# Patient Record
Sex: Female | Born: 1995
Health system: Southern US, Community
[De-identification: ages and names within clinical notes are randomized; demographics above are authoritative.]

## PROBLEM LIST (undated history)

## (undated) DIAGNOSIS — N942 Vaginismus: Secondary | ICD-10-CM

## (undated) DIAGNOSIS — G90A Postural orthostatic tachycardia syndrome (POTS): Secondary | ICD-10-CM

## (undated) DIAGNOSIS — I498 Other specified cardiac arrhythmias: Secondary | ICD-10-CM

## (undated) DIAGNOSIS — Z9189 Other specified personal risk factors, not elsewhere classified: Secondary | ICD-10-CM

## (undated) DIAGNOSIS — I10 Essential (primary) hypertension: Secondary | ICD-10-CM

## (undated) DIAGNOSIS — K3184 Gastroparesis: Secondary | ICD-10-CM

## (undated) DIAGNOSIS — I951 Orthostatic hypotension: Secondary | ICD-10-CM

## (undated) DIAGNOSIS — R000 Tachycardia, unspecified: Secondary | ICD-10-CM

## (undated) DIAGNOSIS — G43909 Migraine, unspecified, not intractable, without status migrainosus: Secondary | ICD-10-CM

## (undated) DIAGNOSIS — G4730 Sleep apnea, unspecified: Secondary | ICD-10-CM

## (undated) DIAGNOSIS — G901 Familial dysautonomia [Riley-Day]: Secondary | ICD-10-CM

## (undated) DIAGNOSIS — K219 Gastro-esophageal reflux disease without esophagitis: Secondary | ICD-10-CM

## (undated) DIAGNOSIS — M1990 Unspecified osteoarthritis, unspecified site: Secondary | ICD-10-CM

## (undated) HISTORY — DX: Migraine, unspecified, not intractable, without status migrainosus: G43.909

## (undated) HISTORY — DX: Gastroparesis: K31.84

## (undated) HISTORY — DX: Other specified cardiac arrhythmias: I49.8

## (undated) HISTORY — PX: ESOPHAGOGASTRODUODENOSCOPY: SHX1529

## (undated) HISTORY — DX: Orthostatic hypotension: I95.1

## (undated) HISTORY — PX: WISDOM TOOTH EXTRACTION: SHX21

## (undated) HISTORY — PX: TONSILLECTOMY AND ADENOIDECTOMY: SUR1326

## (undated) HISTORY — DX: Unspecified osteoarthritis, unspecified site: M19.90

## (undated) HISTORY — DX: Sleep apnea, unspecified: G47.30

## (undated) HISTORY — DX: Vaginismus: N94.2

## (undated) HISTORY — DX: Familial dysautonomia (riley-day): G90.1

## (undated) HISTORY — DX: Postural orthostatic tachycardia syndrome (POTS): G90.A

## (undated) HISTORY — DX: Tachycardia, unspecified: R00.0

## (undated) HISTORY — DX: Gastro-esophageal reflux disease without esophagitis: K21.9

## (undated) HISTORY — DX: Essential (primary) hypertension: I10

## (undated) HISTORY — DX: Other specified personal risk factors, not elsewhere classified: Z91.89

---
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
## 2017-01-21 ENCOUNTER — Encounter: Payer: Self-pay | Admitting: Family Medicine

## 2017-01-21 ENCOUNTER — Ambulatory Visit (INDEPENDENT_AMBULATORY_CARE_PROVIDER_SITE_OTHER): Payer: BLUE CROSS/BLUE SHIELD | Admitting: Family Medicine

## 2017-01-21 DIAGNOSIS — R21 Rash and other nonspecific skin eruption: Secondary | ICD-10-CM

## 2017-01-21 MED ORDER — PREDNISONE 10 MG PO TABS: ORAL_TABLET | ORAL | 0 refills | Status: DC

## 2017-01-21 NOTE — Progress Notes (Signed)
Subjective:  Patient ID: Diana Leon, female    DOB: [birth year]  Age: 21 y.o. MRN: [id number]  CC: New patient; Rash  HPI Diana Leon is a 21 y.o. female presents to the clinic today with complaints of rash.  Rash  2 months.  Located on the lower extremities, left greater than right.  Associated itching.  She has used some Benadryl at night to aid itching.  No new exposures.  She sleeps in the bed with her sister and her sister does not have a rash.  This was thought to be secondary to bug bites but they have not resolved. In fact her rash seems to be persistent and worsening.  No known relieving factors. No known exacerbating factors. No other associated symptoms.  Mucosal surfaces not affected.   PMH, Surgical Hx, Family Hx, Social History reviewed and updated as below.  Past Medical History:  Diagnosis Date  . Dysautonomia   . Gastroparesis   . Migraine   . POTS (postural orthostatic tachycardia syndrome)   . Sleep apnea    Past Surgical History:  Procedure Laterality Date  . ESOPHAGOGASTRODUODENOSCOPY    . TONSILLECTOMY AND ADENOIDECTOMY    . WISDOM TOOTH EXTRACTION     Family History  Problem Relation Age of Onset  . Asthma Mother   . Autoimmune disease Mother   . Diabetes Maternal Grandmother   . Sjogren's syndrome Paternal Grandmother   . Diabetes Paternal Grandfather    Social History  Substance Use Topics  . Smoking status: Never Smoker  . Smokeless tobacco: Not on file  . Alcohol use No   Review of Systems  Gastrointestinal:       Gastroparesis.   Skin: Positive for rash.  Neurological:       Dysautonomia.  All other systems reviewed and are negative.  Objective:   Today's Vitals: BP 90/70 (BP Location: Left Arm, Patient Position: Sitting, Cuff Size: Normal)   Pulse (!) 103   Temp 98.6 F (37 C) (Oral)   Wt 112 lb 6 oz (51 kg)   SpO2 99%   Physical Exam  Constitutional: She is oriented to person, place, and time. She  appears well-developed. No distress.  HENT:  Head: Normocephalic and atraumatic.  Mouth/Throat: Oropharynx is clear and moist.  Normal TM's bilaterally.  Eyes: Conjunctivae are normal. No scleral icterus.  Neck: Normal range of motion.  Cardiovascular: Normal rate and regular rhythm.   No murmur heard. Pulmonary/Chest: Effort normal. She has no wheezes. She has no rales.  Abdominal: Soft. She exhibits no distension.  Musculoskeletal: Normal range of motion.  Neurological: She is alert and oriented to person, place, and time.  Skin:  Lower extremities with scattered erythematous papules.  Psychiatric: She has a normal mood and affect.  Vitals reviewed.  Assessment & Plan:   Problem List Items Addressed This Visit    Rash    New problem. Uncertain etiology at this time. Does not appear to be infectious in origin. Will treat with a trial of oral corticosteroids. If does not improve/resolve, will need to see dermatology for assessment and possible biopsy.         Meds ordered this encounter  Medications  . atenolol (TENORMIN) 25 MG tablet    Sig: Take 25 mg by mouth.  . Cholecalciferol (VITAMIN D3) 3000 units TABS    Sig: Take by mouth.  Marland Kitchen. ibuprofen (ADVIL,MOTRIN) 400 MG tablet    Sig: Take 400 mg by mouth every 6 (six) hours.  Refill:  0  . Magnesium Gluconate (MAGONATE) 500 (27 Mg) MG TABS    Sig: Take 1 tablet by mouth daily.   . cyanocobalamin 500 MCG tablet    Sig: Take 500 mcg by mouth daily.  . predniSONE (DELTASONE) 10 MG tablet    Sig: 4 tablets daily x 3 days, then 3 tablets daily x 3 days, then 2 tablets daily x 3 days, then 1 tablet daily x 3 days.    Dispense:  30 tablet    Refill:  0    Follow-up: PRN  Everlene Other DO [facility]

## 2017-01-21 NOTE — Assessment & Plan Note (Signed)
New problem. Uncertain etiology at this time. Does not appear to be infectious in origin. Will treat with a trial of oral corticosteroids. If does not improve/resolve, will need to see dermatology for assessment and possible biopsy.

## 2017-01-21 NOTE — Patient Instructions (Signed)
Prednisone as prescribed.  If persists, recommend seeing dermatology.  Take care  Dr. Adriana Simasook

## 2017-01-24 ENCOUNTER — Telehealth: Payer: Self-pay | Admitting: Internal Medicine

## 2017-01-24 NOTE — Telephone Encounter (Signed)
Spoke with patient. Patient is aware of instructions. Requested to get referral to dermatology.

## 2017-01-24 NOTE — Telephone Encounter (Signed)
Pt mom Alecia called and stated that pt started her dose of prednisone yesterday. Last night pt was out of it, and had difficulty sleeping, and now pt started vomiting this morning. Please advise, thank you!  Call Alecia @ [phone].

## 2017-01-24 NOTE — Telephone Encounter (Signed)
Called patients mom back and attempted to speak with daughter to get verbal DPR. Phone call was disconnected, called back and left message for her to return call to our office.

## 2017-01-24 NOTE — Telephone Encounter (Signed)
Patients mom called. Patient started dose of prednisone yesterday and mom stated that patient was out of it and could not sleep. This AM patient has vomited x2. No acute distress. Instructed patients mom to hold prednisone and push fluids. Please advise, thanks.

## 2017-01-24 NOTE — Telephone Encounter (Signed)
Stop prednisone increase fluid intake. Can see derm for rash.

## 2017-01-27 ENCOUNTER — Other Ambulatory Visit: Payer: Self-pay | Admitting: Family Medicine

## 2017-01-27 DIAGNOSIS — R21 Rash and other nonspecific skin eruption: Secondary | ICD-10-CM

## 2017-01-27 NOTE — Progress Notes (Signed)
re

## 2017-06-23 ENCOUNTER — Encounter: Payer: Self-pay | Admitting: Internal Medicine

## 2017-06-23 ENCOUNTER — Ambulatory Visit: Payer: BLUE CROSS/BLUE SHIELD | Admitting: Internal Medicine

## 2017-06-23 VITALS — BP 126/78 | HR 83 | Temp 98.3°F | Ht 61.0 in | Wt 116.8 lb

## 2017-06-23 DIAGNOSIS — G4730 Sleep apnea, unspecified: Secondary | ICD-10-CM | POA: Diagnosis not present

## 2017-06-23 DIAGNOSIS — I951 Orthostatic hypotension: Secondary | ICD-10-CM | POA: Diagnosis not present

## 2017-06-23 DIAGNOSIS — R000 Tachycardia, unspecified: Secondary | ICD-10-CM | POA: Diagnosis not present

## 2017-06-23 DIAGNOSIS — R21 Rash and other nonspecific skin eruption: Secondary | ICD-10-CM | POA: Diagnosis not present

## 2017-06-23 DIAGNOSIS — M791 Myalgia, unspecified site: Secondary | ICD-10-CM

## 2017-06-23 DIAGNOSIS — G90A Postural orthostatic tachycardia syndrome (POTS): Secondary | ICD-10-CM

## 2017-06-23 DIAGNOSIS — I498 Other specified cardiac arrhythmias: Secondary | ICD-10-CM

## 2017-06-23 NOTE — Progress Notes (Signed)
Patient ID: Diana Leon, female   DOB: 1995/12/26, 22 y.o.   MRN: [id number]   Subjective:    Patient ID: Diana Leon, female    DOB: [birth year], 22 y.o.   MRN: [id number]  HPI  Patient here to establish care.  She is accompanied by her mother.  History obtained from both of them.  She has a history of dysautonomia with exaggerated/inappropriate sinus tachycardia and POTS. She is followed by [facility] cardiology (Dr Ace Gins).  Last evaluated in 05/2017.  No changes made in medication.  Has been receiving intermittent IV hydration.  This helps for a few days after receiving.  She still has intermittent episodes of increased heart rate and near syncope.  Having some trouble at school with access to handicap parking.  Apparently the spaces are taken up by non handicap students.  She has discussed this with her cardiologist as well.  She also reports a previous rash.  Saw rheumatology.  ANA slightly positive.  W/up in progress.  Rash better.  She has sleep apnea.  Does not use regularly.  She does not get a full night sleep when she uses.  Has tried various settings.  Has not seen her pulmonologist lately.  Is agreeable for f/u to discuss.  She has noticed recently some increased discomfort in her muscles - when hugged.  Plans to discuss more with rheumatology.  W/up in progress as outlined.  LMP 3 weeks ago.  Eating.  Trying to stay hydrated.  No vomiting.  Bowels stable.  Plans to get a service dog.    Past Medical History:  Diagnosis Date  . Arthritis   . Dysautonomia (HCC)   . Gastroparesis   . GERD (gastroesophageal reflux disease)   . History of fainting spells of unknown cause   . Hypertension   . Migraine   . POTS (postural orthostatic tachycardia syndrome)   . Sleep apnea    Past Surgical History:  Procedure Laterality Date  . ESOPHAGOGASTRODUODENOSCOPY    . TONSILLECTOMY AND ADENOIDECTOMY    . WISDOM TOOTH EXTRACTION     Family History  Problem Relation Age of Onset  . Asthma Mother     . Autoimmune disease Mother   . Diabetes Maternal Grandmother   . Sjogren's syndrome Paternal Grandmother   . Diabetes Paternal Grandfather    Social History   Socioeconomic History  . Marital status: Single    Spouse name: None  . Number of children: None  . Years of education: None  . Highest education level: None  Social Needs  . Financial resource strain: None  . Food insecurity - worry: None  . Food insecurity - inability: None  . Transportation needs - medical: None  . Transportation needs - non-medical: None  Occupational History  . Occupation: Consulting civil engineer  Tobacco Use  . Smoking status: Never Smoker  . Smokeless tobacco: Never Used  Substance and Sexual Activity  . Alcohol use: No  . Drug use: No  . Sexual activity: Not Currently    Partners: Male  Other Topics Concern  . None  Social History Narrative  . None    Outpatient Encounter Medications as of 06/23/2017  Medication Sig  . atenolol (TENORMIN) 25 MG tablet Take 25 mg by mouth.  . Cholecalciferol (VITAMIN D3) 3000 units TABS Take by mouth.  . cyanocobalamin 500 MCG tablet Take 500 mcg by mouth daily.  Marland Kitchen ibuprofen (ADVIL,MOTRIN) 400 MG tablet Take 400 mg by mouth every 6 (six) hours.  . Magnesium  Gluconate (MAGONATE) 500 (27 Mg) MG TABS Take 1 tablet by mouth daily.   . predniSONE (DELTASONE) 10 MG tablet 4 tablets daily x 3 days, then 3 tablets daily x 3 days, then 2 tablets daily x 3 days, then 1 tablet daily x 3 days.   No facility-administered encounter medications on file as of 06/23/2017.     Review of Systems  Constitutional: Negative for appetite change, fever and unexpected weight change.  HENT: Negative for congestion and sinus pressure.   Respiratory: Negative for cough, chest tightness and shortness of breath.   Cardiovascular: Negative for chest pain and leg swelling.       Intermittent increased heart rate and near syncope.  Gastrointestinal: Negative for abdominal pain, diarrhea, nausea and  vomiting.  Genitourinary: Negative for difficulty urinating and dysuria.  Musculoskeletal: Negative for back pain and joint swelling.  Skin: Negative for color change and rash.  Neurological: Negative for light-headedness and headaches.       Near syncope as outlined.   Psychiatric/Behavioral: Negative for agitation and dysphoric mood.       Objective:    Physical Exam  Constitutional: She appears well-developed and well-nourished. No distress.  HENT:  Nose: Nose normal.  Mouth/Throat: Oropharynx is clear and moist.  Neck: Neck supple. No thyromegaly present.  Cardiovascular: Normal rate and regular rhythm.  Pulmonary/Chest: Breath sounds normal. No respiratory distress. She has no wheezes.  Abdominal: Soft. Bowel sounds are normal. There is no tenderness.  Musculoskeletal: She exhibits no edema or tenderness.  Lymphadenopathy:    She has no cervical adenopathy.  Skin: No erythema.  No significant rash currently.  Better.   Psychiatric: She has a normal mood and affect. Her behavior is normal.    BP 126/78   Pulse 83   Temp 98.3 F (36.8 C) (Oral)   Ht 5\' 1"  (1.549 m)   Wt 116 lb 12.8 oz (53 kg)   SpO2 98%   BMI 22.07 kg/m  Wt Readings from Last 3 Encounters:  06/23/17 116 lb 12.8 oz (53 kg) (29 %, Z= -0.57)*  01/21/17 112 lb 6 oz (51 kg) (21 %, Z= -0.80)*   * Growth percentiles are based on CDC (Girls, 2-20 Years) data.     Lab Results  Component Value Date   WBC 4.8 08/27/2011   HGB 13.8 08/27/2011   HCT 40.5 08/27/2011   PLT 222 08/27/2011       Assessment & Plan:   Problem List Items Addressed This Visit    POTS (postural orthostatic tachycardia syndrome)    Followed by cardiology.  Still with intermittent episodes as outlined.  Receiving intermittent IV hydration.  Continue f/u with cardiology.  Planning to get a service dog.  Letter written.       Rash    Saw rheumatology and dermatology.  "lupus like".  Slightly elevated ANA.  Saw rheumatology.   W/up in progress.        Sleep apnea    Has documented sleep apnea.  Using BiPAP.  Not using regularly because she does not feel sleeps as well.  Has tried various settings.  Has not seen pulmonary recently.  Discussed f/u.  Arrange.        Tachycardia    On atenolol.  Followed by cardiology.  Receiving intermittent IV fluids.  Discussed staying hydrated, etc.         Other Visit Diagnoses    Muscle pain    -  Primary   reports noticing recently  when someone hugs her, etc.  seeing rheumatology.  w/up in progress for rash and slightly elevated ANA      I spent 45 minutes with the patient.  Time spent obtaining and discussing her past medical history and current issues and symptoms.  Time also spent discussing f/u plans.    Dale DurhamSCOTT, Jennefer Kopp, MD

## 2017-06-23 NOTE — Progress Notes (Signed)
Pre visit review using our clinic review tool, if applicable. No additional management support is needed unless otherwise documented below in the visit note. 

## 2017-06-26 ENCOUNTER — Encounter: Payer: Self-pay | Admitting: Internal Medicine

## 2017-06-26 DIAGNOSIS — R000 Tachycardia, unspecified: Secondary | ICD-10-CM | POA: Insufficient documentation

## 2017-06-26 DIAGNOSIS — I498 Other specified cardiac arrhythmias: Secondary | ICD-10-CM | POA: Insufficient documentation

## 2017-06-26 DIAGNOSIS — I951 Orthostatic hypotension: Secondary | ICD-10-CM

## 2017-06-26 DIAGNOSIS — G90A Postural orthostatic tachycardia syndrome (POTS): Secondary | ICD-10-CM | POA: Insufficient documentation

## 2017-06-26 DIAGNOSIS — G4730 Sleep apnea, unspecified: Secondary | ICD-10-CM | POA: Insufficient documentation

## 2017-06-26 NOTE — Assessment & Plan Note (Signed)
On atenolol.  Followed by cardiology.  Receiving intermittent IV fluids.  Discussed staying hydrated, etc.

## 2017-06-26 NOTE — Assessment & Plan Note (Signed)
Saw rheumatology and dermatology.  "lupus like".  Slightly elevated ANA.  Saw rheumatology.  W/up in progress.

## 2017-06-26 NOTE — Assessment & Plan Note (Signed)
Followed by cardiology.  Still with intermittent episodes as outlined.  Receiving intermittent IV hydration.  Continue f/u with cardiology.  Planning to get a service dog.  Letter written.

## 2017-06-26 NOTE — Assessment & Plan Note (Signed)
Has documented sleep apnea.  Using BiPAP.  Not using regularly because she does not feel sleeps as well.  Has tried various settings.  Has not seen pulmonary recently.  Discussed f/u.  Arrange.

## 2017-08-07 MED ORDER — LORAZEPAM 2 MG/ML IJ SOLN
0.50 mg | INTRAMUSCULAR | Status: DC
Start: ? — End: 2017-08-07

## 2017-08-07 MED ORDER — SODIUM CHLORIDE 0.9 % IV SOLN
100.00 | INTRAVENOUS | Status: DC
Start: ? — End: 2017-08-07

## 2017-08-07 MED ORDER — ACETAMINOPHEN 325 MG PO TABS
650.00 mg | ORAL_TABLET | ORAL | Status: DC
Start: ? — End: 2017-08-07

## 2017-08-07 MED ORDER — ONDANSETRON 4 MG PO TBDP
4.00 mg | ORAL_TABLET | ORAL | Status: DC
Start: ? — End: 2017-08-07

## 2017-08-07 MED ORDER — ATENOLOL 25 MG PO TABS
25.00 mg | ORAL_TABLET | ORAL | Status: DC
Start: 2017-08-07 — End: 2017-08-07

## 2017-12-31 ENCOUNTER — Encounter: Payer: BLUE CROSS/BLUE SHIELD | Admitting: Internal Medicine

## 2018-01-05 ENCOUNTER — Encounter: Payer: Self-pay | Admitting: Internal Medicine

## 2018-01-05 ENCOUNTER — Ambulatory Visit (INDEPENDENT_AMBULATORY_CARE_PROVIDER_SITE_OTHER): Payer: BLUE CROSS/BLUE SHIELD | Admitting: Internal Medicine

## 2018-01-05 VITALS — BP 108/68 | HR 128 | Temp 98.2°F | Ht 61.0 in | Wt 115.4 lb

## 2018-01-05 DIAGNOSIS — Z0000 Encounter for general adult medical examination without abnormal findings: Secondary | ICD-10-CM

## 2018-01-05 DIAGNOSIS — I951 Orthostatic hypotension: Secondary | ICD-10-CM

## 2018-01-05 DIAGNOSIS — L989 Disorder of the skin and subcutaneous tissue, unspecified: Secondary | ICD-10-CM

## 2018-01-05 DIAGNOSIS — R000 Tachycardia, unspecified: Secondary | ICD-10-CM

## 2018-01-05 DIAGNOSIS — G90A Postural orthostatic tachycardia syndrome (POTS): Secondary | ICD-10-CM

## 2018-01-05 DIAGNOSIS — I498 Other specified cardiac arrhythmias: Secondary | ICD-10-CM

## 2018-01-05 NOTE — Assessment & Plan Note (Signed)
[facility]  Just evaluated.  Atenolol increased.  Overall stable.  Follow.

## 2018-01-05 NOTE — Assessment & Plan Note (Signed)
Persistent.  Will have dermatology evaluate.  ?

## 2018-01-05 NOTE — Assessment & Plan Note (Signed)
Physical today 01/05/18.

## 2018-01-05 NOTE — Progress Notes (Signed)
Patient ID: Diana Leon, female   DOB: 1995-10-20, 22 y.o.   MRN: [id number]   Subjective:    Patient ID: Diana Leon, female    DOB: Jan 06, 1996, 22 y.o.   MRN: [id number]  HPI  Patient here for her physical exam.  She is accompanied by her mother.  History obtained from both of them.  Just evaluated by cardiology - Dr Ace Gins [facility] for f/u POTS.  Atenolol was increased.  She is taking 25mg  bid now.  Has service dog.  Doing well with the dog.  Can sense when she is getting ready to feel needs to sit down.  Overall doing well.  Feels things are stable.  No chest pain.  Heart rate stable.  Breathing stable.  No increased acid reflux.  No abdominal pain.  Bowels moving.  Handling stress.     Past Medical History:  Diagnosis Date  . Arthritis   . Dysautonomia (HCC)   . Gastroparesis   . GERD (gastroesophageal reflux disease)   . History of fainting spells of unknown cause   . Hypertension   . Migraine   . POTS (postural orthostatic tachycardia syndrome)   . Sleep apnea    Past Surgical History:  Procedure Laterality Date  . ESOPHAGOGASTRODUODENOSCOPY    . TONSILLECTOMY AND ADENOIDECTOMY    . WISDOM TOOTH EXTRACTION     Family History  Problem Relation Age of Onset  . Asthma Mother   . Autoimmune disease Mother   . Diabetes Maternal Grandmother   . Sjogren's syndrome Paternal Grandmother   . Diabetes Paternal Grandfather    Social History   Socioeconomic History  . Marital status: Single    Spouse name: Not on file  . Number of children: Not on file  . Years of education: Not on file  . Highest education level: Not on file  Occupational History  . Occupation: Consulting civil engineer  Social Needs  . Financial resource strain: Not on file  . Food insecurity:    Worry: Not on file    Inability: Not on file  . Transportation needs:    Medical: Not on file    Non-medical: Not on file  Tobacco Use  . Smoking status: Never Smoker  . Smokeless tobacco: Never Used  Substance and Sexual  Activity  . Alcohol use: No  . Drug use: No  . Sexual activity: Not Currently    Partners: Male  Lifestyle  . Physical activity:    Days per week: Not on file    Minutes per session: Not on file  . Stress: Not on file  Relationships  . Social connections:    Talks on phone: Not on file    Gets together: Not on file    Attends religious service: Not on file    Active member of club or organization: Not on file    Attends meetings of clubs or organizations: Not on file    Relationship status: Not on file  Other Topics Concern  . Not on file  Social History Narrative  . Not on file    Outpatient Encounter Medications as of 01/05/2018  Medication Sig  . atenolol (TENORMIN) 25 MG tablet Take 25 mg by mouth 2 (two) times daily.   . Cholecalciferol (VITAMIN D3) 3000 units TABS Take by mouth.  . cyanocobalamin 500 MCG tablet Take 500 mcg by mouth daily.  Marland Kitchen ibuprofen (ADVIL,MOTRIN) 400 MG tablet Take 400 mg by mouth every 6 (six) hours.  . Magnesium Gluconate (MAGONATE)  500 (27 Mg) MG TABS Take 1 tablet by mouth daily.   . [DISCONTINUED] atenolol (TENORMIN) 25 MG tablet Take 25 mg by mouth.  . [DISCONTINUED] predniSONE (DELTASONE) 10 MG tablet 4 tablets daily x 3 days, then 3 tablets daily x 3 days, then 2 tablets daily x 3 days, then 1 tablet daily x 3 days. (Patient not taking: Reported on 01/05/2018)   No facility-administered encounter medications on file as of 01/05/2018.     Review of Systems  Constitutional: Negative for appetite change and unexpected weight change.  HENT: Negative for congestion and sinus pressure.   Eyes: Negative for pain and visual disturbance.  Respiratory: Negative for cough, chest tightness and shortness of breath.   Cardiovascular: Negative for chest pain, palpitations and leg swelling.  Gastrointestinal: Negative for abdominal pain, diarrhea, nausea and vomiting.  Genitourinary: Negative for difficulty urinating and dysuria.  Musculoskeletal: Negative  for joint swelling and myalgias.  Skin: Negative for color change and rash.  Neurological: Negative for dizziness, light-headedness and headaches.  Hematological: Negative for adenopathy. Does not bruise/bleed easily.  Psychiatric/Behavioral: Negative for agitation and dysphoric mood.       Objective:    Physical Exam  Constitutional: She is oriented to person, place, and time. She appears well-developed and well-nourished. No distress.  HENT:  Nose: Nose normal.  Mouth/Throat: Oropharynx is clear and moist.  Eyes: Right eye exhibits no discharge. Left eye exhibits no discharge. No scleral icterus.  Neck: Neck supple. No thyromegaly present.  Cardiovascular: Normal rate and regular rhythm.  Pulmonary/Chest: Breath sounds normal. No accessory muscle usage. No tachypnea. No respiratory distress. She has no decreased breath sounds. She has no wheezes. She has no rhonchi. Right breast exhibits no inverted nipple, no mass, no nipple discharge and no tenderness (no axillary adenopathy). Left breast exhibits no inverted nipple, no mass, no nipple discharge and no tenderness (no axilarry adenopathy).  Abdominal: Soft. Bowel sounds are normal. There is no tenderness.  Musculoskeletal: She exhibits no edema or tenderness.  Lymphadenopathy:    She has no cervical adenopathy.  Neurological: She is alert and oriented to person, place, and time.  Skin: No rash noted. No erythema.  Psychiatric: She has a normal mood and affect. Her behavior is normal.    BP 108/68 (BP Location: Left Arm, Patient Position: Sitting, Cuff Size: Normal)   Pulse (!) 128   Temp 98.2 F (36.8 C) (Oral)   Ht 5\' 1"  (1.549 m)   Wt 115 lb 6 oz (52.3 kg)   SpO2 97%   BMI 21.80 kg/m  Wt Readings from Last 3 Encounters:  01/05/18 115 lb 6 oz (52.3 kg) (24 %, Z= -0.69)*  06/23/17 116 lb 12.8 oz (53 kg) (29 %, Z= -0.57)*  01/21/17 112 lb 6 oz (51 kg) (21 %, Z= -0.80)*   * Growth percentiles are based on CDC (Girls, 2-20  Years) data.     Lab Results  Component Value Date   WBC 4.8 08/27/2011   HGB 13.8 08/27/2011   HCT 40.5 08/27/2011   PLT 222 08/27/2011       Assessment & Plan:   Problem List Items Addressed This Visit    Healthcare maintenance    Physical today 01/05/18.        Leg lesion    Persistent.  Will have dermatology evaluate.        Relevant Orders   Ambulatory referral to Dermatology   POTS (postural orthostatic tachycardia syndrome)    Followed by  cardiology - Dr Ace Gins [facility].  Just evaluated.  Atenolol increased.  Overall stable.  Follow.        Relevant Medications   atenolol (TENORMIN) 25 MG tablet    Other Visit Diagnoses    Routine general medical examination at a health care facility    -  Primary       Dale [place], MD

## 2018-01-29 ENCOUNTER — Telehealth: Payer: Self-pay | Admitting: Internal Medicine

## 2018-01-29 NOTE — Telephone Encounter (Signed)
Patient's mother returning call. States that she understands the turn around time for paperwork. States that the patient was just informed last night that this paperwork had to be completed before she was able to stay in the apartment for school. States that she really appreciates having them filled out.

## 2018-01-29 NOTE — Telephone Encounter (Signed)
FYI

## 2018-01-29 NOTE — Telephone Encounter (Signed)
Pt mom dropped off service animal  form to be filled out handed to [place].

## 2018-01-29 NOTE — Telephone Encounter (Signed)
Morrie Sheldonshley told patients mom when paper work was dropped off that policy was 5-7 days for paperwork. Left message stating that our policy is typically 5-7 days but we would look at the paper work. I have given paper work to Dr Lorin PicketScott for review.

## 2018-01-30 NOTE — Telephone Encounter (Signed)
Form placed up front and left message for mom to let her know that it is ready

## 2018-01-30 NOTE — Telephone Encounter (Signed)
Form completed.  Placed in box.  She may need to sign one area.

## 2018-02-18 ENCOUNTER — Telehealth: Payer: Self-pay

## 2018-02-18 NOTE — Telephone Encounter (Signed)
Copied from CRM [id number]. Topic: General - Other >> Feb 18, 2018  4:41 PM Trula Slade wrote: Reason for CRM:   Dene Gentry VP of [facility] in [place], [place] [phone] would like for Dr. Lorin Picket to call her concerning the paperwork received for the patient.

## 2018-02-19 NOTE — Telephone Encounter (Signed)
Called.  Questions answered.   

## 2018-06-19 ENCOUNTER — Other Ambulatory Visit: Payer: Self-pay

## 2018-06-19 ENCOUNTER — Ambulatory Visit
Admission: EM | Admit: 2018-06-19 | Discharge: 2018-06-19 | Disposition: A | Discharge status: Home / Self Care | Payer: BLUE CROSS/BLUE SHIELD | Attending: Family Medicine | Admitting: Family Medicine

## 2018-06-19 DIAGNOSIS — B349 Viral infection, unspecified: Secondary | ICD-10-CM | POA: Diagnosis not present

## 2018-06-19 MED ORDER — SODIUM CHLORIDE 0.9 % IV BOLUS
1000.0000 mL | Freq: Once | INTRAVENOUS | Status: AC
  Administered 2018-06-19: 1000 mL via INTRAVENOUS

## 2018-06-19 MED ORDER — OSELTAMIVIR PHOSPHATE 75 MG PO CAPS: 75.0000 mg | ORAL_CAPSULE | Freq: Two times a day (BID) | ORAL | 0 refills | Status: DC

## 2018-06-19 NOTE — ED Triage Notes (Signed)
Patient states that she has pots syndrome and needs IV fluids due to a virus that she currently has. Patient states that she congestion, vomiting and headache.

## 2018-06-19 NOTE — ED Provider Notes (Addendum)
[facility]    CSN: [id number] Arrival date & time: 06/19/18  1719  History   Chief Complaint Chief Complaint  Patient presents with  . URI   HPI  23 year old female with a history of dysautonomia/pots presents with upper respiratory symptoms and decreased p.o. intake.  Patient states that she has been sick since Tuesday.  Her symptoms are worsening as of yesterday.  She reports sore throat, congestion, headaches, fever, T-max 101.  She was seen by pediatrics today with negative flu testing.  She is currently complaining of dizziness as well as her other symptoms.  She states that she is here for IV fluids.  She states that she was sent from pediatrics regarding this.  She states that she has not drink very much today.  She states that she feels nauseated.  She informs me that her cardiologist recommends that she gets IV fluids when she gets ill like this.  Symptoms are severe.  No other associated symptoms.  No known relieving factors.  No other complaints.  PMH, Surgical Hx, Family Hx, Social History reviewed and updated as below.  Past Medical History:  Diagnosis Date  . Arthritis   . Dysautonomia (HCC)   . Gastroparesis   . GERD (gastroesophageal reflux disease)   . History of fainting spells of unknown cause   . Hypertension   . Migraine   . POTS (postural orthostatic tachycardia syndrome)   . Sleep apnea     Patient Active Problem List   Diagnosis Date Noted  . Healthcare maintenance 01/05/2018  . Leg lesion 01/05/2018  . POTS (postural orthostatic tachycardia syndrome) 06/26/2017  . Sleep apnea 06/26/2017  . Tachycardia 06/26/2017  . Rash 01/21/2017    Past Surgical History:  Procedure Laterality Date  . ESOPHAGOGASTRODUODENOSCOPY    . TONSILLECTOMY AND ADENOIDECTOMY    . WISDOM TOOTH EXTRACTION      OB History   No obstetric history on file.      Home Medications    Prior to Admission medications   Medication Sig Start Date End Date  Taking? Authorizing Provider  atenolol (TENORMIN) 25 MG tablet Take 25 mg by mouth 2 (two) times daily.  12/09/17  Yes [provider]  Cholecalciferol (VITAMIN D3) 3000 units TABS Take by mouth.   Yes [provider]  cyanocobalamin 500 MCG tablet Take 500 mcg by mouth daily.   Yes [provider]  ibuprofen (ADVIL,MOTRIN) 400 MG tablet Take 400 mg by mouth every 6 (six) hours. 11/13/16  Yes [provider]  Magnesium Gluconate (MAGONATE) 500 (27 Mg) MG TABS Take 1 tablet by mouth daily.    Yes [provider]  oseltamivir (TAMIFLU) 75 MG capsule Take 1 capsule (75 mg total) by mouth every 12 (twelve) hours. 06/19/18   Tommie Sams, DO    Family History Family History  Problem Relation Age of Onset  . Asthma Mother   . Autoimmune disease Mother   . Diabetes Maternal Grandmother   . Sjogren's syndrome Paternal Grandmother   . Diabetes Paternal Grandfather     Social History Social History   Tobacco Use  . Smoking status: Never Smoker  . Smokeless tobacco: Never Used  Substance Use Topics  . Alcohol use: No  . Drug use: No     Allergies   Sunscreen spf30  [albolene]; Prednisone; and Promethazine   Review of Systems Review of Systems Per HPI  Physical Exam Triage Vital Signs ED Triage Vitals  Enc Vitals Group  BP 06/19/18 1758 102/68     Pulse Rate 06/19/18 1758 (!) 106     Resp 06/19/18 1758 18     Temp 06/19/18 1758 98.7 F (37.1 C)     Temp Source 06/19/18 1758 Oral     SpO2 06/19/18 1758 99 %     Weight 06/19/18 1756 112 lb (50.8 kg)     Height 06/19/18 1756 5\' 1"  (1.549 m)     Head Circumference --      Peak Flow --      Pain Score 06/19/18 1756 7     Pain Loc --      Pain Edu? --      Excl. in GC? --    Updated Vital Signs BP 102/68 (BP Location: Left Arm)   Pulse (!) 106   Temp 98.7 F (37.1 C) (Oral)   Resp 18   Ht 5\' 1"  (1.549 m)   Wt 50.8 kg   LMP 06/05/2018   SpO2 99%   BMI 21.16 kg/m    Visual Acuity Right Eye Distance:   Left Eye Distance:   Bilateral Distance:    Right Eye Near:   Left Eye Near:    Bilateral Near:     Physical Exam Vitals signs and nursing note reviewed.  Constitutional:      General: She is not in acute distress. HENT:     Head: Normocephalic and atraumatic.     Mouth/Throat:     Mouth: Mucous membranes are moist.     Pharynx: Oropharynx is clear.  Eyes:     General:        Right eye: No discharge.        Left eye: No discharge.     Conjunctiva/sclera: Conjunctivae normal.  Cardiovascular:     Rate and Rhythm: Regular rhythm. Tachycardia present.  Pulmonary:     Effort: Pulmonary effort is normal.     Breath sounds: No wheezing, rhonchi or rales.  Neurological:     Mental Status: She is alert.  Psychiatric:        Mood and Affect: Mood normal.        Behavior: Behavior normal.    UC Treatments / Results  Labs (all labs ordered are listed, but only abnormal results are displayed) Labs Reviewed - No data to display  EKG None  Radiology No results found.  Procedures Procedures (including critical care time)  Medications Ordered in UC Medications  sodium chloride 0.9 % bolus 1,000 mL (1,000 mLs Intravenous New Bag/Given 06/19/18 1827)    Initial Impression / Assessment and Plan / UC Course  I have reviewed the triage vital signs and the nursing notes.  Pertinent labs & imaging results that were available during my care of the patient were reviewed by me and considered in my medical decision making (see chart for details).    23 year old female presents with a viral illness. IV fluids given due to poor PO intake and POTS.  Discussed the possibility of influenza despite negative testing.  Father and patient elected to consider starting an antiviral.  Rx was sent.  Supportive care and Zofran as needed. Push fluids.  Final Clinical Impressions(s) / UC Diagnoses   Final diagnoses:  Viral illness     Discharge  Instructions     Rest. Fluids  Zofran as needed.  Take care  Dr. Adriana Simas     ED Prescriptions    Medication Sig Dispense Auth. Provider   oseltamivir (TAMIFLU) 75 MG  capsule Take 1 capsule (75 mg total) by mouth every 12 (twelve) hours. 10 capsule Tommie Samsook, Taiyana Kissler G, DO     Controlled Substance Prescriptions [place] Controlled Substance Registry consulted? Not Applicable   Tommie SamsCook, Jayesh Marbach G, DO 06/19/18 1858    Tommie Samsook, Nicklous Aburto G, DO 06/19/18 [id number]

## 2018-06-19 NOTE — Discharge Instructions (Signed)
Rest. Fluids. ° °Zofran as needed.  ° °Take care ° °Dr. Yong Wahlquist  °

## 2018-06-25 ENCOUNTER — Ambulatory Visit: Payer: BLUE CROSS/BLUE SHIELD | Admitting: Internal Medicine

## 2018-06-25 DIAGNOSIS — R51 Headache: Secondary | ICD-10-CM

## 2018-06-25 DIAGNOSIS — R109 Unspecified abdominal pain: Secondary | ICD-10-CM

## 2018-06-25 DIAGNOSIS — M25532 Pain in left wrist: Secondary | ICD-10-CM

## 2018-06-25 DIAGNOSIS — R000 Tachycardia, unspecified: Secondary | ICD-10-CM | POA: Diagnosis not present

## 2018-06-25 DIAGNOSIS — I951 Orthostatic hypotension: Secondary | ICD-10-CM

## 2018-06-25 DIAGNOSIS — G8929 Other chronic pain: Secondary | ICD-10-CM

## 2018-06-25 DIAGNOSIS — I498 Other specified cardiac arrhythmias: Secondary | ICD-10-CM

## 2018-06-25 DIAGNOSIS — M25531 Pain in right wrist: Secondary | ICD-10-CM

## 2018-06-25 DIAGNOSIS — G4730 Sleep apnea, unspecified: Secondary | ICD-10-CM

## 2018-06-25 DIAGNOSIS — G90A Postural orthostatic tachycardia syndrome (POTS): Secondary | ICD-10-CM

## 2018-06-25 DIAGNOSIS — R519 Headache, unspecified: Secondary | ICD-10-CM

## 2018-06-25 LAB — BASIC METABOLIC PANEL
BUN: 10 mg/dL (ref 6–23)
CO2: 28 mEq/L (ref 19–32)
Calcium: 9.9 mg/dL (ref 8.4–10.5)
Chloride: 102 mEq/L (ref 96–112)
Creatinine, Ser: 0.67 mg/dL (ref 0.40–1.20)
GFR: 118.72 mL/min (ref >60.00)
Glucose, Bld: 93 mg/dL (ref 70–99)
Potassium: 4 mEq/L (ref 3.5–5.1)
Sodium: 136 mEq/L (ref 135–145)

## 2018-06-25 LAB — CBC WITH DIFFERENTIAL/PLATELET
Basophils Absolute: 0 10*3/uL (ref 0.0–0.1)
Basophils Relative: 0.7 % (ref 0.0–3.0)
Eosinophils Absolute: 0.1 10*3/uL (ref 0.0–0.7)
Eosinophils Relative: 2.4 % (ref 0.0–5.0)
HCT: 43.5 % (ref 36.0–46.0)
Hemoglobin: 14.7 g/dL (ref 12.0–15.0)
Lymphocytes Relative: 44.4 % (ref 12.0–46.0)
Lymphs Abs: 1.7 10*3/uL (ref 0.7–4.0)
MCHC: 33.8 g/dL (ref 30.0–36.0)
MCV: 87.1 fl (ref 78.0–100.0)
Monocytes Absolute: 0.3 10*3/uL (ref 0.1–1.0)
Monocytes Relative: 7.8 % (ref 3.0–12.0)
Neutro Abs: 1.7 10*3/uL (ref 1.4–7.7)
Neutrophils Relative %: 44.7 % (ref 43.0–77.0)
Platelets: 212 10*3/uL (ref 150.0–400.0)
RBC: 4.99 Mil/uL (ref 3.87–5.11)
RDW: 12.9 % (ref 11.5–14.6)
WBC: 3.8 10*3/uL — ABNORMAL LOW (ref 4.5–10.5)

## 2018-06-25 LAB — HEPATIC FUNCTION PANEL
ALBUMIN: 4.5 g/dL (ref 3.5–5.2)
ALT: 11 U/L (ref 0–35)
AST: 14 U/L (ref 0–37)
Alkaline Phosphatase: 51 U/L (ref 39–117)
Bilirubin, Direct: 0.1 mg/dL (ref 0.0–0.3)
Total Bilirubin: 0.3 mg/dL (ref 0.2–1.2)
Total Protein: 6.8 g/dL (ref 6.0–8.3)

## 2018-06-25 LAB — LIPASE: Lipase: 10 U/L — ABNORMAL LOW (ref 11.0–59.0)

## 2018-06-25 LAB — TSH: TSH: 3.18 u[IU]/mL (ref 0.35–5.50)

## 2018-06-25 LAB — SEDIMENTATION RATE: Sed Rate: 1 mm/hr (ref 0–20)

## 2018-06-25 LAB — AMYLASE: AMYLASE: 35 U/L (ref 27–131)

## 2018-06-25 NOTE — Patient Instructions (Signed)
pepcid 20mg - take one tablet 30 minutes before breakfast 

## 2018-06-25 NOTE — Progress Notes (Signed)
Patient ID: Quitman Livings, female   DOB: Dec 22, 1995, 23 y.o.   MRN: [id number]   Subjective:    Patient ID: Quitman Livings, female    DOB: 1995-07-05, 23 y.o.   MRN: [id number]  HPI  Patient here for a scheduled follow up.  She is accompanied by her mother.  History obtained from both of them.  Reports bilateral wrist pain.  States notices when she types on a keyboard.  Saw her chiropractor.  Had "shock therapy".  Did not help.  Also reports some nausea.  Also reports some stomach pain after eating.  Some emesis occasionally.  Has tried eating smaller portions - eating more frequent.  May flare 2x/week.  Has had GI issues since she was young.  Had UGI previously.  Revealed some reflux.  Persistent/worsening problem now.  Also reports headaches.  States occurs on left side.  States left side of face will be numb when occurs.  Also reports some drooping of her face when occurs.  Has been present and worsened for the last few months.  Has been taking ibuprofen.  States has to lie down and cut lights off.  Was diagnosed at age 23 with chronic migraines.  Had MRI age 23.  States these headaches occurred all over there head - not just left sided.  States the headaches now may last up to 2 hours.  Trying to stay active.  Has her support dog.  He has helped significantly with sensing her "episodes".  No chest pain.  Breathing stable.  Bowels moving.     Past Medical History:  Diagnosis Date  . Arthritis   . Dysautonomia ([place])   . Gastroparesis   . GERD (gastroesophageal reflux disease)   . History of fainting spells of unknown cause   . Hypertension   . Migraine   . POTS (postural orthostatic tachycardia syndrome)   . Sleep apnea    Past Surgical History:  Procedure Laterality Date  . ESOPHAGOGASTRODUODENOSCOPY    . TONSILLECTOMY AND ADENOIDECTOMY    . WISDOM TOOTH EXTRACTION     Family History  Problem Relation Age of Onset  . Asthma Mother   . Autoimmune disease Mother   . Diabetes Maternal  Grandmother   . Sjogren's syndrome Paternal Grandmother   . Diabetes Paternal Grandfather    Social History   Socioeconomic History  . Marital status: Single    Spouse name: Not on file  . Number of children: Not on file  . Years of education: Not on file  . Highest education level: Not on file  Occupational History  . Occupation: Ship broker  Social Needs  . Financial resource strain: Not on file  . Food insecurity:    Worry: Not on file    Inability: Not on file  . Transportation needs:    Medical: Not on file    Non-medical: Not on file  Tobacco Use  . Smoking status: Never Smoker  . Smokeless tobacco: Never Used  Substance and Sexual Activity  . Alcohol use: No  . Drug use: No  . Sexual activity: Not Currently    Partners: Male  Lifestyle  . Physical activity:    Days per week: Not on file    Minutes per session: Not on file  . Stress: Not on file  Relationships  . Social connections:    Talks on phone: Not on file    Gets together: Not on file    Attends religious service: Not on file  Active member of club or organization: Not on file    Attends meetings of clubs or organizations: Not on file    Relationship status: Not on file  Other Topics Concern  . Not on file  Social History Narrative  . Not on file    Outpatient Encounter Medications as of 06/25/2018  Medication Sig  . atenolol (TENORMIN) 25 MG tablet Take 25 mg by mouth 2 (two) times daily.   . Cholecalciferol (VITAMIN D3) 3000 units TABS Take by mouth.  . cyanocobalamin 500 MCG tablet Take 500 mcg by mouth daily.  Marland Kitchen ibuprofen (ADVIL,MOTRIN) 400 MG tablet Take 400 mg by mouth every 6 (six) hours.  . Magnesium Gluconate (MAGONATE) 500 (27 Mg) MG TABS Take 1 tablet by mouth daily.   . [DISCONTINUED] oseltamivir (TAMIFLU) 75 MG capsule Take 1 capsule (75 mg total) by mouth every 12 (twelve) hours.   No facility-administered encounter medications on file as of 06/25/2018.     Review of Systems    Constitutional: Negative for appetite change and unexpected weight change.  HENT: Negative for congestion and sinus pressure.   Respiratory: Negative for cough, chest tightness and shortness of breath.   Cardiovascular: Negative for chest pain, palpitations and leg swelling.  Gastrointestinal: Positive for abdominal pain, nausea and vomiting. Negative for diarrhea.       Intermittent abdominal pain after eating.    Genitourinary: Negative for difficulty urinating and dysuria.  Musculoskeletal: Negative for joint swelling.       Bilateral wrist pain.    Skin: Negative for color change and rash.  Neurological: Positive for headaches. Negative for dizziness and light-headedness.  Psychiatric/Behavioral: Negative for agitation and dysphoric mood.      Objective:     Blood pressure recheck:  106/74  Physical Exam Constitutional:      General: She is not in acute distress.    Appearance: Normal appearance.  HENT:     Nose: Nose normal. No congestion.     Mouth/Throat:     Pharynx: No oropharyngeal exudate or posterior oropharyngeal erythema.  Eyes:     Pupils: Pupils are equal, round, and reactive to light.  Neck:     Musculoskeletal: Neck supple. No muscular tenderness.     Thyroid: No thyromegaly.  Cardiovascular:     Rate and Rhythm: Normal rate and regular rhythm.  Pulmonary:     Effort: No respiratory distress.     Breath sounds: Normal breath sounds. No wheezing.  Abdominal:     General: Bowel sounds are normal.     Palpations: Abdomen is soft.     Tenderness: There is no abdominal tenderness.  Musculoskeletal:        General: No swelling or tenderness.  Lymphadenopathy:     Cervical: No cervical adenopathy.  Skin:    Findings: No erythema or rash.  Neurological:     Mental Status: She is alert.  Psychiatric:        Mood and Affect: Mood normal.        Behavior: Behavior normal.     BP 108/70 (BP Location: Left Arm, Patient Position: Sitting, Cuff Size: Normal)    Pulse (!) 106   Temp 98.1 F (36.7 C) (Oral)   Resp 16   Wt 116 lb 12.8 oz (53 kg)   LMP 06/05/2018   SpO2 97%   BMI 22.07 kg/m  Wt Readings from Last 3 Encounters:  06/25/18 116 lb 12.8 oz (53 kg)  06/19/18 112 lb (50.8 kg)  01/05/18  115 lb 6 oz (52.3 kg) (24 %, Z= -0.69)*   * Growth percentiles are based on CDC (Girls, 2-20 Years) data.     Lab Results  Component Value Date   WBC 3.8 (L) 06/25/2018   HGB 14.7 06/25/2018   HCT 43.5 06/25/2018   PLT 212.0 06/25/2018   GLUCOSE 93 06/25/2018   ALT 11 06/25/2018   AST 14 06/25/2018   NA 136 06/25/2018   K 4.0 06/25/2018   CL 102 06/25/2018   CREATININE 0.67 06/25/2018   BUN 10 06/25/2018   CO2 28 06/25/2018   TSH 3.18 06/25/2018       Assessment & Plan:   Problem List Items Addressed This Visit    Abdominal pain    Abdominal pain and nausea with occasional emesis as outlined.  Has had w/up previously.  Discussed further w/up.  Discussed a trial of pepcid.  Refer to Gi as outlined for further evaluation and w/up.        Relevant Orders   CBC with Differential/Platelet (Completed)   Hepatic function panel (Completed)   TSH (Completed)   Basic metabolic panel (Completed)   Amylase (Completed)   Lipase (Completed)   Ambulatory referral to Gastroenterology   Headache    Headache as outlined.  Some focal numbness and reported drooping when headache occurs. Discussed taking magnesium.  Check esr and routine labs.  Has a history of chronic migraines, but given this is a different headache with focal changes - will obtain MRI brain.  Pt in agreement.        Relevant Orders   Sedimentation rate (Completed)   MR Brain W Wo Contrast   MR Brain W Wo Contrast   POTS (postural orthostatic tachycardia syndrome)    Followed by Dr Theadore Nan  [facility] cardiology.  Overall stable.  Has her support dog which has helped with sensing her episodes.  Follow.        Sleep apnea    BiPAP.        Wrist pain    Bilateral wrist pain.   Wrist splints.  Follow.           I spent 40 minutes with the patient and more than 50% of the time was spent in consultation regarding the above.  Time spent discussing current symptoms and concerns.  Time also spent discussing further w/up and evaluation.    Einar Pheasant, MD

## 2018-06-28 ENCOUNTER — Encounter: Payer: Self-pay | Admitting: Internal Medicine

## 2018-06-28 DIAGNOSIS — M25539 Pain in unspecified wrist: Secondary | ICD-10-CM | POA: Insufficient documentation

## 2018-06-28 NOTE — Assessment & Plan Note (Signed)
Headache as outlined.  Some focal numbness and reported drooping when headache occurs. Discussed taking magnesium.  Check esr and routine labs.  Has a history of chronic migraines, but given this is a different headache with focal changes - will obtain MRI brain.  Pt in agreement.

## 2018-06-28 NOTE — Assessment & Plan Note (Signed)
Abdominal pain and nausea with occasional emesis as outlined.  Has had w/up previously.  Discussed further w/up.  Discussed a trial of pepcid.  Refer to Gi as outlined for further evaluation and w/up.

## 2018-06-28 NOTE — Assessment & Plan Note (Signed)
Bilateral wrist pain.  Wrist splints.  Follow.

## 2018-06-28 NOTE — Assessment & Plan Note (Signed)
BiPAP

## 2018-06-28 NOTE — Assessment & Plan Note (Signed)
Followed by Dr Ace Gins  [facility] cardiology.  Overall stable.  Has her support dog which has helped with sensing her episodes.  Follow.

## 2018-07-01 ENCOUNTER — Other Ambulatory Visit: Payer: Self-pay | Admitting: Internal Medicine

## 2018-07-01 NOTE — Telephone Encounter (Signed)
Copied from CRM [id number]. Topic: Quick Communication - Rx Refill/Question >> Jul 01, 2018  4:09 PM Baldo Daub L wrote: Medication: atenolol (TENORMIN) 25 MG tablet  Has the patient contacted their pharmacy? Yes - states it is not there.  Pt requested this at last OV (Agent: If no, request that the patient contact the pharmacy for the refill.) (Agent: If yes, when and what did the pharmacy advise?)  Preferred Pharmacy (with phone number or street name): [id number] - [place], [place] - [address] [phone] (Phone) [phone] (Fax)  Agent: Please be advised that RX refills may take up to 3 business days. We ask that you follow-up with your pharmacy.

## 2018-07-01 NOTE — Telephone Encounter (Signed)
Requested medication (s) are due for refill today:  Not specified  Requested medication (s) are on the active medication list  yes  Last refill: 01/05/18  Future visit scheduled yes   07/20/2018  Notes to clinic:Historical provider  Requested Prescriptions  Pending Prescriptions Disp Refills   atenolol (TENORMIN) 25 MG tablet      Sig: Take 1 tablet (25 mg total) by mouth 2 (two) times daily.     Cardiovascular:  Beta Blockers Passed - 07/01/2018  4:41 PM      Passed - Last BP in normal range    BP Readings from Last 1 Encounters:  06/25/18 108/70         Passed - Last Heart Rate in normal range    Pulse Readings from Last 1 Encounters:  06/25/18 (!) 106         Passed - Valid encounter within last 6 months    Recent Outpatient Visits          6 days ago Abdominal pain, unspecified abdominal location   [facility] [place], Westley Hummer, MD   5 months ago Routine general medical examination at a health care facility   [facility], Westley Hummer, MD   1 year ago Muscle pain   [facility] [place], Westley Hummer, MD   1 year ago Rash   [facility], Verdis Fredericks[place], [place]      Future Appointments            In 2 weeks Dale [place], MD [facility], [facility]   In 2 months Dale Reddick, MD [facility], [facility]   In 6 months Dale [place], MD [facility], [facility]

## 2018-07-06 MED ORDER — ATENOLOL 25 MG PO TABS: 25.0000 mg | ORAL_TABLET | Freq: Two times a day (BID) | ORAL | 1 refills | Status: AC

## 2018-07-07 ENCOUNTER — Other Ambulatory Visit: Payer: Self-pay | Admitting: Internal Medicine

## 2018-07-07 ENCOUNTER — Telehealth: Payer: Self-pay

## 2018-07-07 DIAGNOSIS — R51 Headache: Principal | ICD-10-CM

## 2018-07-07 DIAGNOSIS — R519 Headache, unspecified: Secondary | ICD-10-CM

## 2018-07-07 NOTE — Progress Notes (Signed)
Order placed for neurology referral.   

## 2018-07-07 NOTE — Telephone Encounter (Signed)
Copied from CRM [id number]. Topic: General - Other >> Jul 07, 2018  8:53 AM Gerrianne Scale wrote: Reason for CRM: pt Mother Gladstone Lighter calling about a referral to a neurologist please call her at [phone]

## 2018-07-07 NOTE — Telephone Encounter (Signed)
Still wants at [facility] and is aware that someone should be contacting her with appt date/time

## 2018-07-07 NOTE — Telephone Encounter (Signed)
Neurology referral placed.  Confirm still wants at [facility]. Someone should be contacting them with appt date and time.

## 2018-07-07 NOTE — Telephone Encounter (Signed)
Was this something discussed at last visit?

## 2018-07-09 ENCOUNTER — Ambulatory Visit
Admission: RE | Admit: 2018-07-09 | Discharge: 2018-07-09 | Disposition: A | Discharge status: Home / Self Care | Payer: BLUE CROSS/BLUE SHIELD | Source: Ambulatory Visit | Attending: Internal Medicine | Admitting: Internal Medicine

## 2018-07-09 ENCOUNTER — Encounter (INDEPENDENT_AMBULATORY_CARE_PROVIDER_SITE_OTHER): Payer: Self-pay

## 2018-07-09 DIAGNOSIS — R519 Headache, unspecified: Secondary | ICD-10-CM

## 2018-07-09 DIAGNOSIS — R51 Headache: Secondary | ICD-10-CM | POA: Diagnosis not present

## 2018-07-09 MED ORDER — GADOBUTROL 1 MMOL/ML IV SOLN
5.0000 mL | Freq: Once | INTRAVENOUS | Status: AC | PRN
  Administered 2018-07-09: 5 mL via INTRAVENOUS

## 2018-07-10 ENCOUNTER — Encounter: Payer: Self-pay | Admitting: Internal Medicine

## 2018-07-16 ENCOUNTER — Telehealth: Payer: Self-pay

## 2018-07-16 NOTE — Telephone Encounter (Signed)
She has office visit with me on 07/20/18.  I am ok if she wants to postpone this appt.  Confirm doing ok.  Ok to wait on lab work.

## 2018-07-16 NOTE — Telephone Encounter (Signed)
Copied from CRM [id number]. Topic: General - Other >> Jul 16, 2018  2:09 PM Jaquita Rector A wrote: Reason for CRM: Mom called to say that patient is away in [place] and would like to know if blood work can wait until her March appointment so she does not have to make an unnecessary trip all the way to [place] and back just for the appointment on 07/20/2018. Please advise mom waiting on a call back please. Ph# [phone]

## 2018-07-16 NOTE — Telephone Encounter (Signed)
Sent to PCP please advise.  

## 2018-07-17 NOTE — Telephone Encounter (Signed)
Patient is aware and states she is doing okay. Patient has postponed appt until 09/24/18

## 2018-07-20 ENCOUNTER — Ambulatory Visit: Payer: BLUE CROSS/BLUE SHIELD | Admitting: Internal Medicine

## 2018-09-24 ENCOUNTER — Encounter: Payer: Self-pay | Admitting: Internal Medicine

## 2018-09-24 ENCOUNTER — Ambulatory Visit (INDEPENDENT_AMBULATORY_CARE_PROVIDER_SITE_OTHER): Payer: BLUE CROSS/BLUE SHIELD | Admitting: Internal Medicine

## 2018-09-24 DIAGNOSIS — M25532 Pain in left wrist: Secondary | ICD-10-CM

## 2018-09-24 DIAGNOSIS — R51 Headache: Secondary | ICD-10-CM | POA: Diagnosis not present

## 2018-09-24 DIAGNOSIS — I951 Orthostatic hypotension: Secondary | ICD-10-CM

## 2018-09-24 DIAGNOSIS — M25531 Pain in right wrist: Secondary | ICD-10-CM | POA: Diagnosis not present

## 2018-09-24 DIAGNOSIS — R000 Tachycardia, unspecified: Secondary | ICD-10-CM | POA: Diagnosis not present

## 2018-09-24 DIAGNOSIS — I498 Other specified cardiac arrhythmias: Secondary | ICD-10-CM

## 2018-09-24 DIAGNOSIS — R519 Headache, unspecified: Secondary | ICD-10-CM

## 2018-09-24 DIAGNOSIS — R109 Unspecified abdominal pain: Secondary | ICD-10-CM | POA: Diagnosis not present

## 2018-09-24 DIAGNOSIS — G90A Postural orthostatic tachycardia syndrome (POTS): Secondary | ICD-10-CM

## 2018-09-24 NOTE — Progress Notes (Addendum)
Patient ID: Diana Leon, female   DOB: 1995-07-25, 23 y.o.   MRN: [id number]  Virtual Visit - Note  This visit type was conducted due to national recommendations for restrictions regarding the COVID-19 pandemic (e.g. social distancing).  This format is felt to be most appropriate for this patient at this time.  All issues noted in this document were discussed and addressed.  No physical exam was performed (except for noted visual exam findings with Video Visits).   I connected with Ouida Sills on 09/24/18 at  3:30 PM EDT by a video enabled telemedicine application and verified that I am speaking with the correct person using two identifiers. Location patient: home Location provider: work Persons participating in the virtual visit: patient, provider  I discussed the limitations, risks, security and privacy concerns of performing an evaluation and management service by video. The patient expressed understanding and agreed to proceed.   Reason for visit: scheduled follow up visit  HPI: She reports things are stable.  Doing her school on line.  Staying in.  No known COVID exposure.  Saw her cardiologist 06/30/18.  Stable.  Recommended continuing atenolol and f/u in 6 months.  Service dog is working out well for her.  The previous issues she was experiencing are still present.  Wearing a wrist splint.  Still with wrist discomfort.  Plans to f/u on this once able to schedule an appt (after COVID concerns).  She is still having some GI issues.  Never started the pepcid. Plans to start. Discussed again with her today.  Also with headache.  MRI ok.  Plans to f/u with GI and neurology - when able to schedule.  Handling stress.  No sob.  No chest pain.  Bowels moving.     ROS: See pertinent positives and negatives per HPI.  Past Medical History:  Diagnosis Date  . Arthritis   . Dysautonomia ([place])   . Gastroparesis   . GERD (gastroesophageal reflux disease)   . History of fainting spells of unknown  cause   . Hypertension   . Migraine   . POTS (postural orthostatic tachycardia syndrome)   . Sleep apnea     Past Surgical History:  Procedure Laterality Date  . ESOPHAGOGASTRODUODENOSCOPY    . TONSILLECTOMY AND ADENOIDECTOMY    . WISDOM TOOTH EXTRACTION      Family History  Problem Relation Age of Onset  . Asthma Mother   . Autoimmune disease Mother   . Diabetes Maternal Grandmother   . Sjogren's syndrome Paternal Grandmother   . Diabetes Paternal Grandfather     SOCIAL HX: reviewed.    Current Outpatient Medications:  .  atenolol (TENORMIN) 25 MG tablet, Take 1 tablet (25 mg total) by mouth 2 (two) times daily., Disp: 90 tablet, Rfl: 1 .  Cholecalciferol (VITAMIN D3) 3000 units TABS, Take by mouth., Disp: , Rfl:  .  cyanocobalamin 500 MCG tablet, Take 500 mcg by mouth daily., Disp: , Rfl:  .  ibuprofen (ADVIL,MOTRIN) 400 MG tablet, Take 400 mg by mouth every 6 (six) hours., Disp: , Rfl: 0 .  Magnesium Gluconate (MAGONATE) 500 (27 Mg) MG TABS, Take 1 tablet by mouth daily. , Disp: , Rfl:   EXAM:  GENERAL: alert, oriented, appears well and in no acute distress  HEENT: atraumatic, conjunttiva clear, no obvious abnormalities on inspection of external nose and ears  NECK: normal movements of the head and neck  LUNGS: on inspection no signs of respiratory distress, breathing rate appears normal, no  obvious gross SOB, gasping or wheezing  CV: no obvious cyanosis  PSYCH/NEURO: pleasant and cooperative, no obvious depression or anxiety, speech and thought processing grossly intact  ASSESSMENT AND PLAN:  Discussed the following assessment and plan:  Abdominal pain, unspecified abdominal location  Nonintractable headache, unspecified chronicity pattern, unspecified headache type  POTS (postural orthostatic tachycardia syndrome)  Pain in both wrists  Abdominal pain Has the issue with intermittent abdominal pain and emesis/regurgitation.  Discussed eating small meals.   Start pepcid.  F/u with GI as planned.    Headache MRI ok.  Plans to f/u with neurology as outlined.  Previous ESR ok.    POTS (postural orthostatic tachycardia syndrome) Followed by Dr Theadore Nan [facility] cardiology.  Continues on Atenolol.  Stable.    Wrist pain Continue wrist splints.      I discussed the assessment and treatment plan with the patient. The patient was provided an opportunity to ask questions and all were answered. The patient agreed with the plan and demonstrated an understanding of the instructions.   The patient was advised to call back or seek an in-person evaluation if the symptoms worsen or if the condition fails to improve as anticipated.  I provided 20 minutes of non-face-to-face time during this encounter.   Einar Pheasant, MD

## 2018-09-27 ENCOUNTER — Encounter: Payer: Self-pay | Admitting: Internal Medicine

## 2018-09-27 NOTE — Assessment & Plan Note (Signed)
Has the issue with intermittent abdominal pain and emesis/regurgitation.  Discussed eating small meals.  Start pepcid.  F/u with GI as planned.

## 2018-09-27 NOTE — Assessment & Plan Note (Signed)
Continue wrist splints 

## 2018-09-27 NOTE — Assessment & Plan Note (Signed)
Followed by Dr Ace Gins [facility] cardiology.  Continues on Atenolol.  Stable.

## 2018-09-27 NOTE — Assessment & Plan Note (Signed)
MRI ok.  Plans to f/u with neurology as outlined.  Previous ESR ok.

## 2019-01-06 ENCOUNTER — Other Ambulatory Visit: Payer: Self-pay | Admitting: Internal Medicine

## 2019-01-06 ENCOUNTER — Other Ambulatory Visit: Payer: Self-pay

## 2019-01-06 ENCOUNTER — Encounter: Payer: Self-pay | Admitting: Internal Medicine

## 2019-01-06 ENCOUNTER — Ambulatory Visit (INDEPENDENT_AMBULATORY_CARE_PROVIDER_SITE_OTHER): Payer: BLUE CROSS/BLUE SHIELD | Admitting: Internal Medicine

## 2019-01-06 VITALS — BP 120/74 | HR 98 | Temp 98.1°F | Resp 16 | Ht 61.0 in | Wt 121.4 lb

## 2019-01-06 DIAGNOSIS — R1110 Vomiting, unspecified: Secondary | ICD-10-CM

## 2019-01-06 DIAGNOSIS — I951 Orthostatic hypotension: Secondary | ICD-10-CM

## 2019-01-06 DIAGNOSIS — G90A Postural orthostatic tachycardia syndrome (POTS): Secondary | ICD-10-CM

## 2019-01-06 DIAGNOSIS — Z0000 Encounter for general adult medical examination without abnormal findings: Secondary | ICD-10-CM | POA: Diagnosis not present

## 2019-01-06 DIAGNOSIS — G4730 Sleep apnea, unspecified: Secondary | ICD-10-CM

## 2019-01-06 DIAGNOSIS — R519 Headache, unspecified: Secondary | ICD-10-CM

## 2019-01-06 DIAGNOSIS — R51 Headache: Secondary | ICD-10-CM

## 2019-01-06 DIAGNOSIS — I498 Other specified cardiac arrhythmias: Secondary | ICD-10-CM

## 2019-01-06 DIAGNOSIS — R000 Tachycardia, unspecified: Secondary | ICD-10-CM

## 2019-01-06 DIAGNOSIS — D72819 Decreased white blood cell count, unspecified: Secondary | ICD-10-CM

## 2019-01-06 LAB — CBC WITH DIFFERENTIAL/PLATELET
Basophils Absolute: 0 10*3/uL (ref 0.0–0.1)
Basophils Relative: 1 % (ref 0.0–3.0)
Eosinophils Absolute: 0 10*3/uL (ref 0.0–0.7)
Eosinophils Relative: 1.2 % (ref 0.0–5.0)
HCT: 42.2 % (ref 36.0–46.0)
Hemoglobin: 14.2 g/dL (ref 12.0–15.0)
Lymphocytes Relative: 41.3 % (ref 12.0–46.0)
Lymphs Abs: 1.1 10*3/uL (ref 0.7–4.0)
MCHC: 33.5 g/dL (ref 30.0–36.0)
MCV: 88.5 fl (ref 78.0–100.0)
Monocytes Absolute: 0.2 10*3/uL (ref 0.1–1.0)
Monocytes Relative: 7.3 % (ref 3.0–12.0)
Neutro Abs: 1.3 10*3/uL — ABNORMAL LOW (ref 1.4–7.7)
Neutrophils Relative %: 49.2 % (ref 43.0–77.0)
Platelets: 169 10*3/uL (ref 150.0–400.0)
RBC: 4.77 Mil/uL (ref 3.87–5.11)
RDW: 12.8 % (ref 11.5–14.6)
WBC: 2.7 10*3/uL — ABNORMAL LOW (ref 4.5–10.5)

## 2019-01-06 NOTE — Assessment & Plan Note (Addendum)
Physical today 01/06/19.  Unable to do a pelvic exam.  Discussed gyn referral.  Wants to hold.

## 2019-01-06 NOTE — Progress Notes (Signed)
Patient ID: Diana Leon, female   DOB: 1996/05/04, 23 y.o.   MRN: [id number]   Subjective:    Patient ID: Diana Leon, female    DOB: 1996/05/04, 23 y.o.   MRN: [id number]  HPI  Patient here for her physical exam.  She reports "things are stable".  Saw cardiology 06/2018.  Stable.  On atenolol.  Tries to stay active.  Still having some headaches.  Taking magnesium.  No significant change.  Previous MRI ok.  Had discussed neurology evaluation.  Also reports some persistent issues with regurgitation of food.  States occurs with most meals.  She is eating.  Weight is stable, actually increased some from last check.  No vomiting.  Pepcid did not help.  Bowels moving.  No chest pain.  Breathing stable.  No fever.  At home now. Trying to get a job.     Past Medical History:  Diagnosis Date  . Arthritis   . Dysautonomia (HCC)   . Gastroparesis   . GERD (gastroesophageal reflux disease)   . History of fainting spells of unknown cause   . Hypertension   . Migraine   . POTS (postural orthostatic tachycardia syndrome)   . Sleep apnea    Past Surgical History:  Procedure Laterality Date  . ESOPHAGOGASTRODUODENOSCOPY    . TONSILLECTOMY AND ADENOIDECTOMY    . WISDOM TOOTH EXTRACTION     Family History  Problem Relation Age of Onset  . Asthma Mother   . Autoimmune disease Mother   . Diabetes Maternal Grandmother   . Sjogren's syndrome Paternal Grandmother   . Diabetes Paternal Grandfather    Social History   Socioeconomic History  . Marital status: Single    Spouse name: Not on file  . Number of children: Not on file  . Years of education: Not on file  . Highest education level: Not on file  Occupational History  . Occupation: Consulting civil engineerstudent  Social Needs  . Financial resource strain: Not on file  . Food insecurity    Worry: Not on file    Inability: Not on file  . Transportation needs    Medical: Not on file    Non-medical: Not on file  Tobacco Use  . Smoking status: Never Smoker   . Smokeless tobacco: Never Used  Substance and Sexual Activity  . Alcohol use: No  . Drug use: No  . Sexual activity: Not Currently    Partners: Male  Lifestyle  . Physical activity    Days per week: Not on file    Minutes per session: Not on file  . Stress: Not on file  Relationships  . Social Musicianconnections    Talks on phone: Not on file    Gets together: Not on file    Attends religious service: Not on file    Active member of club or organization: Not on file    Attends meetings of clubs or organizations: Not on file    Relationship status: Not on file  Other Topics Concern  . Not on file  Social History Narrative  . Not on file    Outpatient Encounter Medications as of 01/06/2019  Medication Sig  . atenolol (TENORMIN) 25 MG tablet Take 1 tablet (25 mg total) by mouth 2 (two) times daily.  . Cholecalciferol (VITAMIN D3) 3000 units TABS Take by mouth.  . cyanocobalamin 500 MCG tablet Take 500 mcg by mouth daily.  Marland Kitchen. ibuprofen (ADVIL,MOTRIN) 400 MG tablet Take 400 mg by mouth every 6 (six)  hours.  . Magnesium Gluconate (MAGONATE) 500 (27 Mg) MG TABS Take 1 tablet by mouth daily.    No facility-administered encounter medications on file as of 01/06/2019.     Review of Systems  Constitutional: Negative for appetite change and unexpected weight change.  HENT: Negative for congestion and sinus pressure.   Eyes: Negative for pain and visual disturbance.  Respiratory: Negative for cough, chest tightness and shortness of breath.   Cardiovascular: Negative for chest pain, palpitations and leg swelling.  Gastrointestinal: Negative for abdominal pain, diarrhea, nausea and vomiting.       Persistent regurgitation of food as outlined.    Genitourinary: Negative for difficulty urinating and dysuria.  Musculoskeletal: Negative for joint swelling and myalgias.  Skin: Negative for color change and rash.  Neurological: Negative for dizziness and light-headedness.       Headache as  outlined.    Hematological: Negative for adenopathy. Does not bruise/bleed easily.  Psychiatric/Behavioral: Negative for agitation and dysphoric mood.       Objective:    Physical Exam Constitutional:      General: She is not in acute distress.    Appearance: Normal appearance. She is well-developed.  HENT:     Right Ear: External ear normal. There is no impacted cerumen.     Left Ear: External ear normal. There is no impacted cerumen.  Eyes:     General: No scleral icterus.       Right eye: No discharge.        Left eye: No discharge.     Conjunctiva/sclera: Conjunctivae normal.  Neck:     Musculoskeletal: Neck supple. No muscular tenderness.     Thyroid: No thyromegaly.  Cardiovascular:     Rate and Rhythm: Normal rate and regular rhythm.  Pulmonary:     Effort: No tachypnea, accessory muscle usage or respiratory distress.     Breath sounds: Normal breath sounds. No decreased breath sounds or wheezing.  Chest:     Breasts:        Right: No inverted nipple, mass, nipple discharge or tenderness (no axillary adenopathy).        Left: No inverted nipple, mass, nipple discharge or tenderness (no axilarry adenopathy).  Abdominal:     General: Bowel sounds are normal.     Palpations: Abdomen is soft.     Tenderness: There is no abdominal tenderness.  Genitourinary:    Comments: Appeared to have normal external genitalia.  Attempted intravaginal exam.  Unable to insert speculum.  Pt reported pain - with applying the tip of the speculum to the vaginal opening.  (even before inserting).  Unable to perform bimanual exam.   Musculoskeletal:        General: No swelling or tenderness.  Lymphadenopathy:     Cervical: No cervical adenopathy.  Skin:    Findings: No erythema or rash.  Neurological:     Mental Status: She is alert and oriented to person, place, and time.  Psychiatric:        Mood and Affect: Mood normal.        Behavior: Behavior normal.     BP 120/74   Pulse 98    Temp 98.1 F (36.7 C) (Oral)   Resp 16   Ht 5\' 1"  (1.549 m)   Wt 121 lb 6.4 oz (55.1 kg)   SpO2 98%   BMI 22.94 kg/m  Wt Readings from Last 3 Encounters:  01/06/19 121 lb 6.4 oz (55.1 kg)  06/25/18 116 lb 12.8  oz (53 kg)  06/19/18 112 lb (50.8 kg)     Lab Results  Component Value Date   WBC 2.7 (L) 01/06/2019   HGB 14.2 01/06/2019   HCT 42.2 01/06/2019   PLT 169.0 01/06/2019   GLUCOSE 93 06/25/2018   ALT 11 06/25/2018   AST 14 06/25/2018   NA 136 06/25/2018   K 4.0 06/25/2018   CL 102 06/25/2018   CREATININE 0.67 06/25/2018   BUN 10 06/25/2018   CO2 28 06/25/2018   TSH 3.18 06/25/2018    Mr Brain W Wo Contrast  Result Date: 07/09/2018 CLINICAL DATA:  Chronic migraine headaches over the last 4-6 months. Symptoms are worsening. EXAM: MRI HEAD WITHOUT AND WITH CONTRAST TECHNIQUE: Multiplanar, multiecho pulse sequences of the brain and surrounding structures were obtained without and with intravenous contrast. CONTRAST:  5 cc Gadavist COMPARISON:  02/25/2006 FINDINGS: Brain: The brain has a normal appearance without evidence of malformation, atrophy, old or acute small or large vessel infarction, mass lesion, hemorrhage, hydrocephalus or extra-axial collection. After contrast administration, no abnormal enhancement occurs. Vascular: Major vessels at the base of the brain show flow. Venous sinuses appear patent. Skull and upper cervical spine: Normal. Sinuses/Orbits: Clear/normal. Other: None significant. IMPRESSION: Normal examination.  No abnormality seen to explain headaches. Electronically Signed   By: Paulina FusiMark  Shogry M.D.   On: 07/09/2018 16:41       Assessment & Plan:   Problem List Items Addressed This Visit    Headache    MRI ok.  Have referred to neurology.  No known triggers.  Taking magnesium.  F/u with referral.       Healthcare maintenance    Physical today 01/06/19.  Unable to do a pelvic exam.  Discussed gyn referral.  Wants to hold.        POTS (postural  orthostatic tachycardia syndrome)    [facility]  On atenolol.  Stable.        Regurgitation of food    Persistent regurgitation of food.  pepcid did not help.  Have made referral to GI.  F/u with referral.        Sleep apnea    BiPAP.        Other Visit Diagnoses    Leukopenia, unspecified type    -  Primary   Relevant Orders   CBC with Differential/Platelet (Completed)       Dale Durhamharlene Shanigua Gibb, MD

## 2019-01-06 NOTE — Progress Notes (Signed)
Order placed for f/u labs.  

## 2019-01-09 ENCOUNTER — Encounter: Payer: Self-pay | Admitting: Internal Medicine

## 2019-01-09 DIAGNOSIS — R1110 Vomiting, unspecified: Secondary | ICD-10-CM | POA: Insufficient documentation

## 2019-01-09 NOTE — Assessment & Plan Note (Signed)
Followed by Dr Theadore Nan [facility] cardiology.  On atenolol.  Stable.

## 2019-01-09 NOTE — Assessment & Plan Note (Signed)
MRI ok.  Have referred to neurology.  No known triggers.  Taking magnesium.  F/u with referral.

## 2019-01-09 NOTE — Assessment & Plan Note (Signed)
Persistent regurgitation of food.  pepcid did not help.  Have made referral to GI.  F/u with referral.

## 2019-01-09 NOTE — Assessment & Plan Note (Signed)
BiPAP

## 2019-01-20 ENCOUNTER — Telehealth: Payer: Self-pay | Admitting: Internal Medicine

## 2019-01-20 NOTE — Telephone Encounter (Signed)
-----   Message from Eustace Pen sent at 01/20/2019  9:02 AM EDT ----- Regarding: RE: neurology and GI referral It looks like they both tried to call her to schedule but they could not get a hold of her. I have sent her the phone numbers so she can call them back to get these scheduled. Melissa ----- Message ----- From: Einar Pheasant, MD Sent: 01/09/2019   7:02 PM EDT To: Eustace Pen Subject: neurology and GI referral                      I had referred her to GI and neurology - [facility].  States she never heard and never scheduled.  Can you help with this and notify pt what she needs to do.  Thanks    Dr Nicki Reaper

## 2019-01-29 ENCOUNTER — Telehealth: Payer: Self-pay | Admitting: Internal Medicine

## 2019-01-29 NOTE — Telephone Encounter (Signed)
rx signed and placed in box.   

## 2019-01-29 NOTE — Telephone Encounter (Signed)
Needs labs placed externally and I will fax Monday morning

## 2019-01-29 NOTE — Telephone Encounter (Signed)
Copied from [place] [phone]. Topic: General - Inquiry >> Jan 29, 2019  3:53 PM Percell Belt A wrote: Reason for CRM: mother called in and stated that she was to call and give Dr Nicki Reaper nurse a fax number to fax over labs to  Fax [phone] lab draw at [facility] in [place]

## 2019-02-01 ENCOUNTER — Telehealth: Payer: Self-pay | Admitting: *Deleted

## 2019-02-01 ENCOUNTER — Other Ambulatory Visit: Payer: BLUE CROSS/BLUE SHIELD

## 2019-02-01 NOTE — Telephone Encounter (Signed)
Faxed labs

## 2019-02-01 NOTE — Telephone Encounter (Signed)
Labs faxed. Pt aware

## 2019-02-01 NOTE — Telephone Encounter (Signed)
Copied from [place] [phone]. Topic: General - Inquiry >> Jan 29, 2019  3:53 PM Percell Belt A wrote: Reason for CRM: mother called in and stated that she was to call and give Dr Nicki Reaper nurse a fax number to fax over labs to  Fax number-[phone] lab draw at [facility] in [place] >> Feb 01, 2019 11:42 AM Ivar Drape wrote: Patient's mother is calling to inquire if the order for labs has been faxed to [facility].

## 2019-02-04 LAB — CBC AND DIFFERENTIAL
HCT: 42 (ref 36–46)
Hemoglobin: 13.9 (ref 12.0–16.0)
Platelets: 173 (ref 150–399)
WBC: 5.3

## 2019-07-15 ENCOUNTER — Ambulatory Visit: Payer: BLUE CROSS/BLUE SHIELD | Admitting: Internal Medicine

## 2019-08-31 IMAGING — MR MR HEAD WO/W CM
12 series · 48 of 48 positions shown · IV contrast (gadavist)
Comparison: 02/25/2006

CLINICAL DATA: Chronic migraine headaches over the last 4-6 months.
Symptoms are worsening.

EXAM:
MRI HEAD WITHOUT AND WITH CONTRAST
TECHNIQUE: Multiplanar, multiecho pulse sequences of the brain and surrounding
structures were obtained without and with intravenous contrast.
CONTRAST:  5 cc Gadavist

[Series 2: T1 · sagittal · 5.0mm · 0.45mm/px · 1 of 23 slices shown (1 of 2)]
[im 1/23]
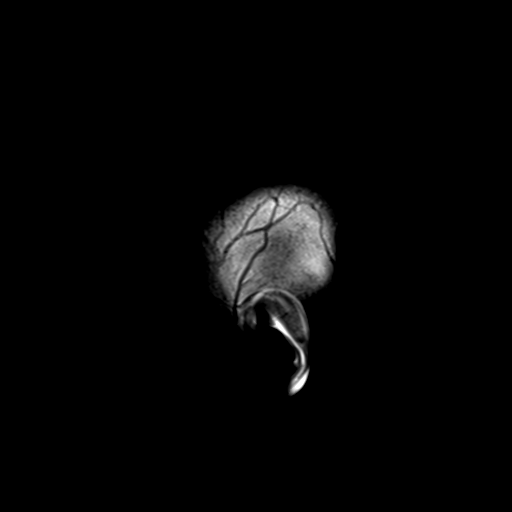

[Series 4: DWI · axial · 3.0mm · 1.20mm/px · z∈[-53,+108]mm · 3 of 55 slices shown (1 of 4)]
[im 1/55]
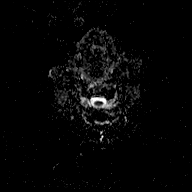
[im 28/55]
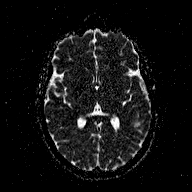
[im 55/55]
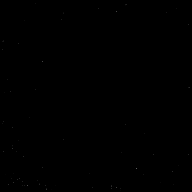

[Series 6: DWI · coronal · 3.0mm · 1.15mm/px · 3 of 46 slices shown (2 of 4)]
[im 1/46]
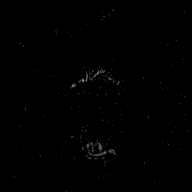
[im 23/46]
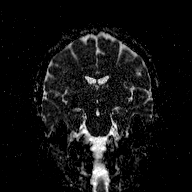
[im 46/46]
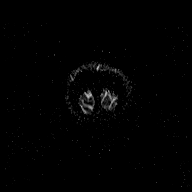

[Series 7: T2 · axial · 5.0mm · 0.72mm/px · z∈[-47,+107]mm · 2 of 23 slices shown (1 of 2)]
[im 1/23]
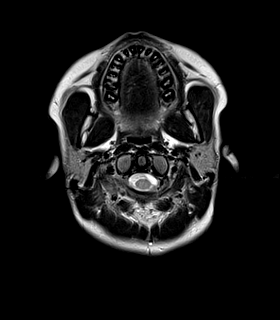
[im 23/23]
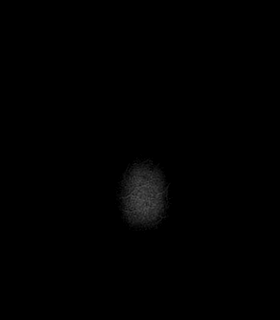

[Series 8: FLAIR · axial · 3.0mm · 0.45mm/px · z∈[-51,+111]mm · 4 of 55 slices shown]
[im 1/55]
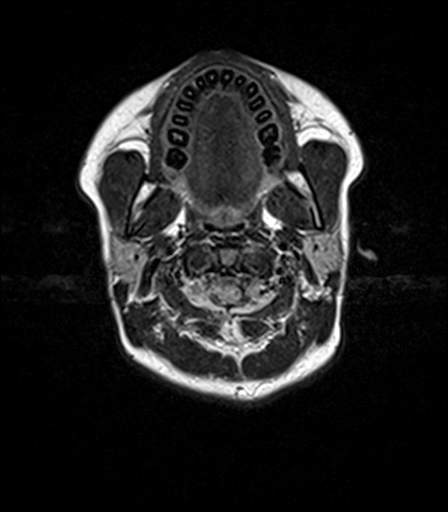
[im 19/55]
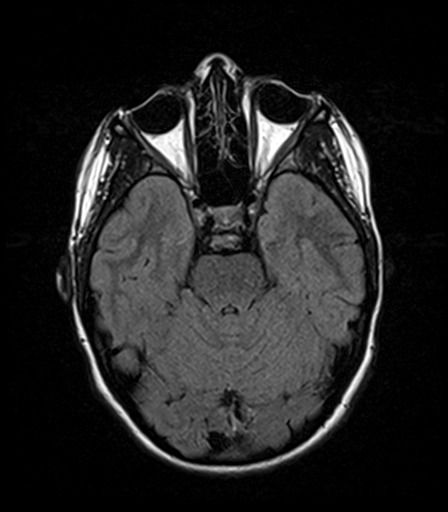
[im 37/55]
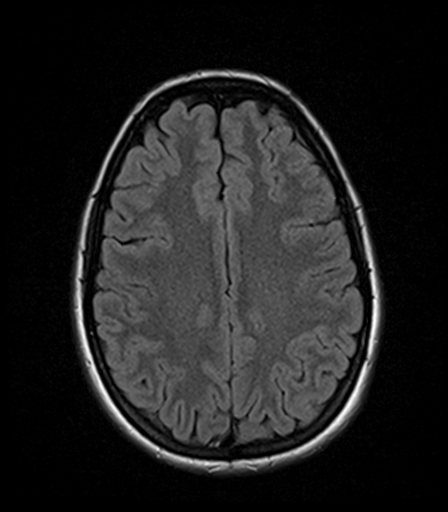
[im 55/55]
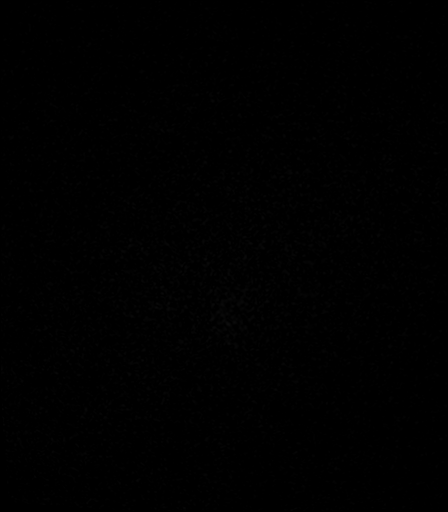

[Series 9: T2 · axial · 5.0mm · 0.72mm/px · z∈[-47,+107]mm · 2 of 23 slices shown (2 of 2)]
[im 1/23]
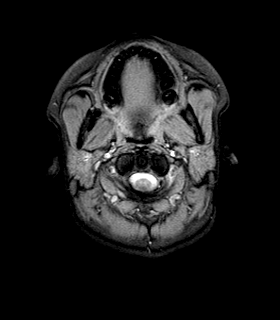
[im 23/23]
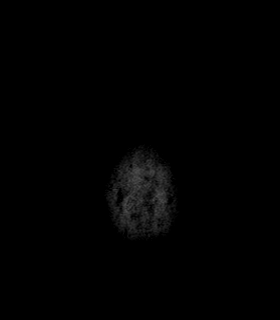

[Series 10: T1 · axial · 1.0mm · 1.00mm/px · z∈[-50,+108]mm · 11 of 160 slices shown (2 of 2)]
[im 1/160]
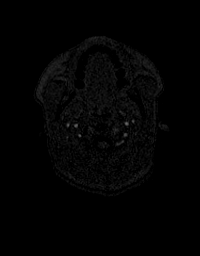
[im 16/160]
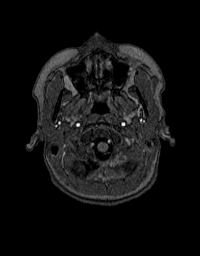
[im 32/160]
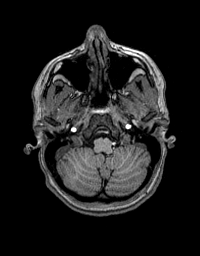
[im 48/160]
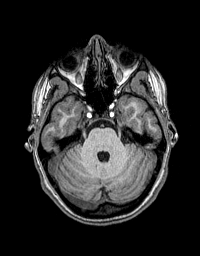
[im 64/160]
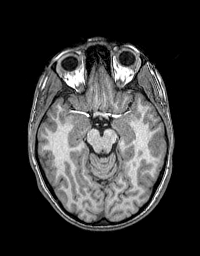
[im 80/160]
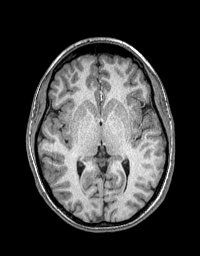
[im 96/160]
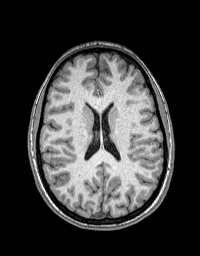
[im 112/160]
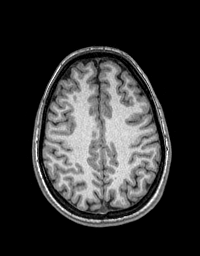
[im 128/160]
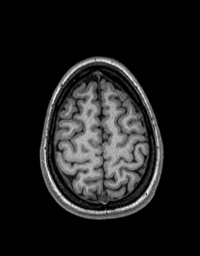
[im 144/160]
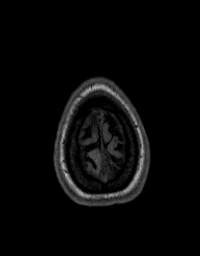
[im 160/160]
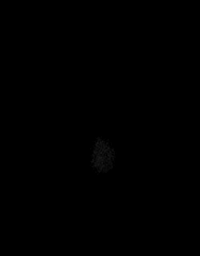

[Series 11: T2 post-contrast · coronal · 5.0mm · 0.43mm/px · 2 of 31 slices shown]
[im 1/31]
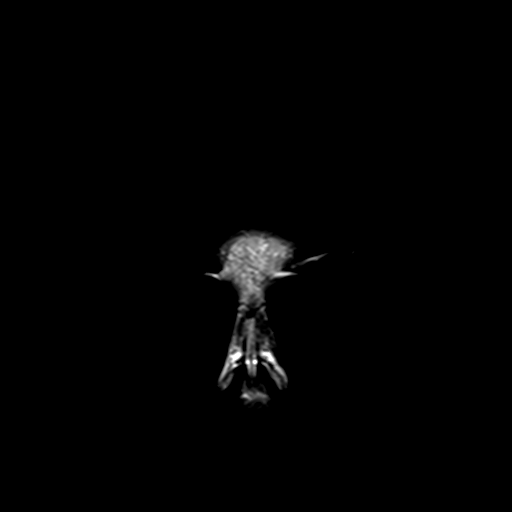
[im 31/31]
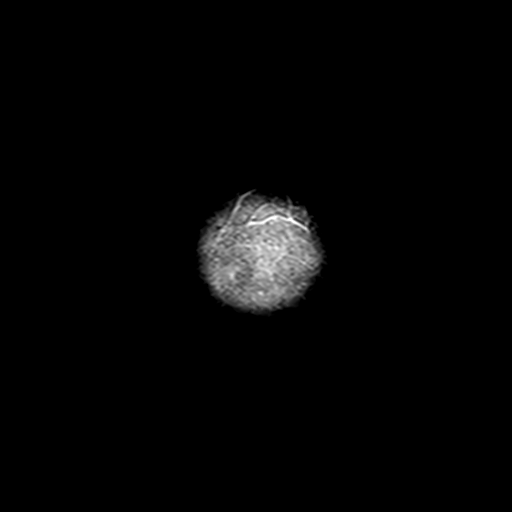

[Series 12: T1 post-contrast · axial · 1.0mm · 1.00mm/px · z∈[-50,+108]mm · 11 of 160 slices shown (1 of 2)]
[im 1/160]
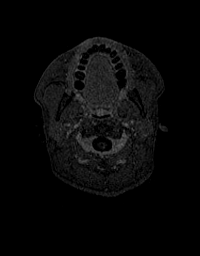
[im 16/160]
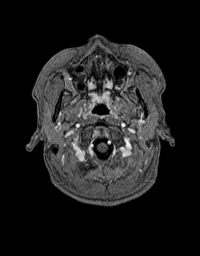
[im 32/160]
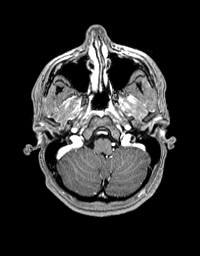
[im 48/160]
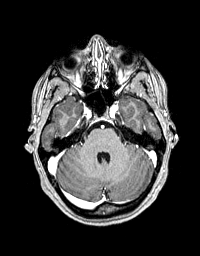
[im 64/160]
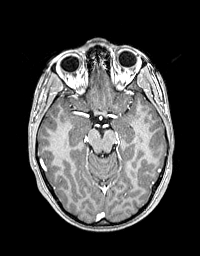
[im 80/160]
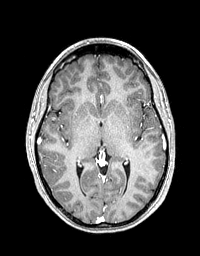
[im 96/160]
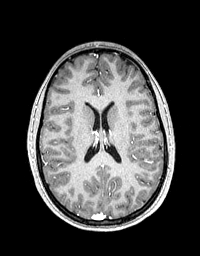
[im 112/160]
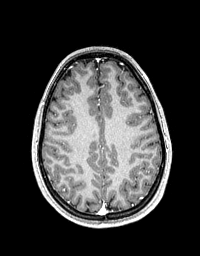
[im 128/160]
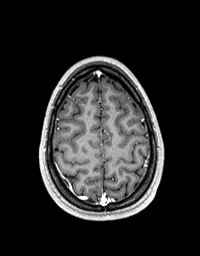
[im 144/160]
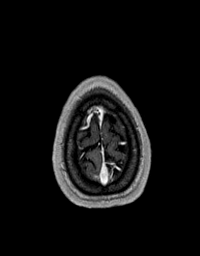
[im 160/160]
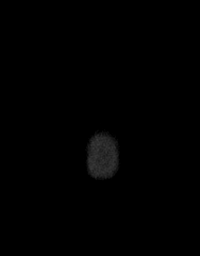

[Series 13: T1 post-contrast · coronal · 5.0mm · 0.43mm/px · 2 of 31 slices shown (2 of 2)]
[im 1/31]
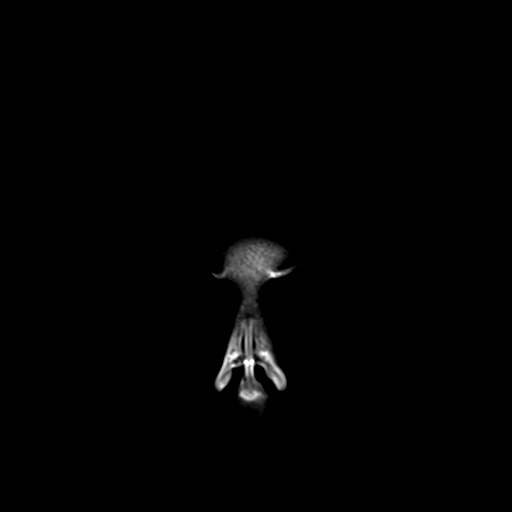
[im 31/31]
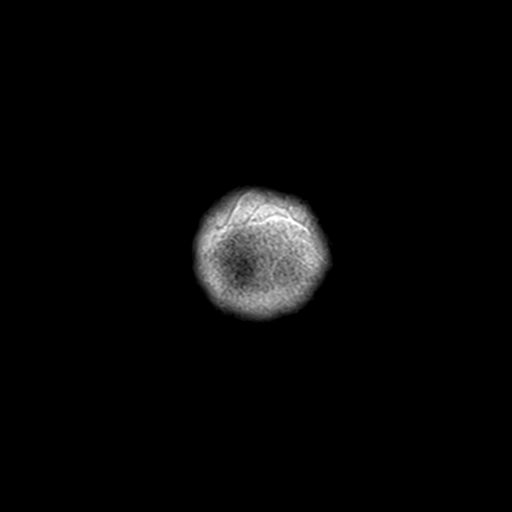

[Series 100: DWI · axial · 3.0mm · 1.20mm/px · z∈[-53,+108]mm · 4 of 55 slices shown (3 of 4)]
[im 1/55]
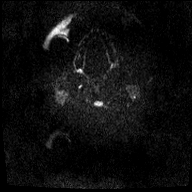
[im 19/55]
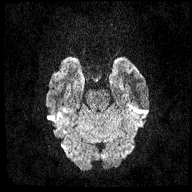
[im 37/55]
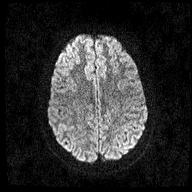
[im 55/55]
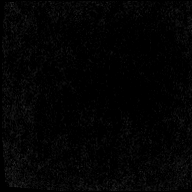

[Series 101: DWI · coronal · 3.0mm · 1.15mm/px · 3 of 46 slices shown (4 of 4)]
[im 1/46]
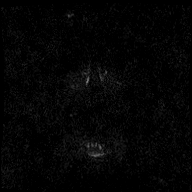
[im 23/46]
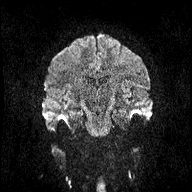
[im 46/46]
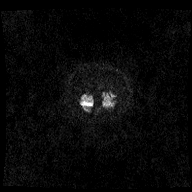

[48 of 48 positions shown; findings below may reference images not displayed]

FINDINGS: Brain: The brain has a normal appearance without evidence of
malformation, atrophy, old or acute small or large vessel
infarction, mass lesion, hemorrhage, hydrocephalus or extra-axial
collection. After contrast administration, no abnormal enhancement
occurs.

Vascular: Major vessels at the base of the brain show flow. Venous
sinuses appear patent.

Skull and upper cervical spine: Normal.

Sinuses/Orbits: Clear/normal.

Other: None significant.
IMPRESSION: Normal examination.  No abnormality seen to explain headaches.

## 2019-09-02 ENCOUNTER — Telehealth: Payer: BLUE CROSS/BLUE SHIELD | Admitting: Internal Medicine

## 2020-05-22 ENCOUNTER — Telehealth: Payer: Self-pay

## 2020-05-22 DIAGNOSIS — R1110 Vomiting, unspecified: Secondary | ICD-10-CM

## 2020-05-22 NOTE — Telephone Encounter (Signed)
Would you like patient to come in for an appt to discuss below?

## 2020-05-22 NOTE — Telephone Encounter (Signed)
Patient states she throws up in her mouth like she was doing before; patient was able to swallow it before but now she's having a very hard time keeping anything down. This is happening more and more frequently and sometimes she does not make it to the bathroom before she throws up. Patient doesn't really get nauseous but feels a lot pressure. Patient states this happens about 5-10 minutes after she eats. Patient states she's pretty much got the same sx as before just more frequently and more intense. Patient does not have any preference on who she sees in GI.

## 2020-05-22 NOTE — Telephone Encounter (Signed)
Need to confirm with pt what symptoms she is having now and which GI provider she would prefer to see.

## 2020-05-22 NOTE — Telephone Encounter (Signed)
Pt called and states that she needs a new referral for GI. She states that at the time of last referral-her insurance was not covering anything. She states that symptoms have gotten worse in the past week and she regrets not getting anything done sooner. I let her know that she may need an appt with PCP or another provider for this issue since it has been over a year. She insisted that Dr Lorin Picket would send referral without appt because Dr Lorin Picket is already familiar with her symptoms. Please advise.

## 2020-05-22 NOTE — Telephone Encounter (Signed)
Tried to call but no answer and VM is full  

## 2020-05-23 NOTE — Telephone Encounter (Signed)
Spoke with pt to let her know that the referral has been placed. Pt stated that she was able to keep very little food down yesterday. She stated that she does not keep up with her weight but feels like she has lost about 5lbs  over the last couple of weeks.

## 2020-05-23 NOTE — Telephone Encounter (Signed)
Attempted to call pt. Mail box full. Will try again at a later time.  

## 2020-05-23 NOTE — Telephone Encounter (Signed)
I have placed the order for the referral.  Someone should be contacting her with an appt date and time.  Appears symptoms have worsened.  Need to confirm she is able to eat and keep some food down.  Weight loss?  She had requested referral, but if acute symptoms, needs to be evaluated.

## 2020-05-23 NOTE — Telephone Encounter (Signed)
(  see me about this pt).  Reviewed note/notes.  Order has been placed for GI referral.  If she is not able to keep food down, vomiting, etc.  Will need to be seen.  (per previous note, did not want appt other than with GI).

## 2020-05-24 NOTE — Telephone Encounter (Signed)
Patient was returning

## 2020-05-24 NOTE — Telephone Encounter (Signed)
Attempted to call pt. Mail box full. Will try again at a later time.

## 2020-05-24 NOTE — Telephone Encounter (Signed)
Patient aware of below message and will call if gets worse before she can see GI to be seen

## 2020-06-06 ENCOUNTER — Telehealth: Payer: Self-pay | Admitting: Internal Medicine

## 2020-06-06 NOTE — Telephone Encounter (Signed)
Patient called and said she needs a referral to GI at [facility] not [facility]. Patient was advised since it is the holidays she may have to wait.

## 2020-06-07 NOTE — Telephone Encounter (Signed)
Left detailed message for patient.

## 2020-06-12 NOTE — Telephone Encounter (Signed)
Per pt referral note it states prefers [facility] or [facility]. I sent referral to [facility]. Please advise and Thank you!

## 2020-06-13 NOTE — Telephone Encounter (Signed)
Is pt ok with [facility]?

## 2020-06-13 NOTE — Telephone Encounter (Signed)
Ok.  Let me know if I need to do anything.  Thank you.

## 2020-06-14 NOTE — Telephone Encounter (Signed)
Left a message to call back and confirm if patient is ok with [facility] for the referral.

## 2020-07-13 ENCOUNTER — Encounter: Payer: Self-pay | Admitting: *Deleted

## 2021-06-27 ENCOUNTER — Ambulatory Visit (INDEPENDENT_AMBULATORY_CARE_PROVIDER_SITE_OTHER): Payer: BLUE CROSS/BLUE SHIELD | Admitting: Obstetrics

## 2021-06-27 ENCOUNTER — Other Ambulatory Visit: Payer: Self-pay

## 2021-06-27 VITALS — BP 128/88 | HR 82 | Ht 61.0 in | Wt 103.1 lb

## 2021-06-27 DIAGNOSIS — Z3009 Encounter for other general counseling and advice on contraception: Secondary | ICD-10-CM | POA: Diagnosis not present

## 2021-06-27 NOTE — Progress Notes (Signed)
°  Subjective:    Diana Leon is a 26 y.o. female who presents for contraception counseling. The patient has no complaints today. The patient has never been sexually active. She plans to become sexually active after her marriage in March. She would like to avoid pregnancy at this time due to several medical conditions and is looking for reliable, long-term, reversible birth control. She prefers a non-hormonal method but would like to consider all options.   Menstrual History: OB History   No obstetric history on file.      Patient's last menstrual period was 06/05/2021 (exact date). Her periods are regular.   Review of Systems A comprehensive review of systems was negative.   Objective:    No exam performed today;  not indicated for contraceptive counseling .    Upstream - 06/27/21 1534       Pregnancy Intention Screening   Does the patient want to become pregnant in the next year? No    Does the patient's partner want to become pregnant in the next year? No    Would the patient like to discuss contraceptive options today? Yes      Contraception Wrap Up   Current Method Abstinence    End Method Unknown/Not Reported    Contraception Counseling Provided Yes            The pregnancy intention screening data noted above was reviewed. Potential methods of contraception were discussed. The patient elected to proceed with Unknown/Not Reported.   Assessment:    26 y.o. G0, interested in starting IUD, no contraindications. She is still considering all her options.  Plan:    All questions answered. Will make an appointment for IUD placement if desired. Recommend Pap at that time since she has never had one.   Guadlupe Spanish, CNM

## 2021-07-25 ENCOUNTER — Telehealth: Payer: Self-pay | Admitting: Obstetrics

## 2021-07-25 NOTE — Telephone Encounter (Signed)
LM with patient via MyChart

## 2021-07-25 NOTE — Telephone Encounter (Signed)
Pt called stating that she had went to ER not long ago had labs done- her platelets were low she is scheduled for IUD placement this Friday and was wanting to know if this would be a problem. Please Advise.

## 2021-07-27 ENCOUNTER — Other Ambulatory Visit: Payer: Self-pay

## 2021-07-27 ENCOUNTER — Encounter: Payer: Self-pay | Admitting: Obstetrics

## 2021-07-27 ENCOUNTER — Ambulatory Visit (INDEPENDENT_AMBULATORY_CARE_PROVIDER_SITE_OTHER): Payer: BLUE CROSS/BLUE SHIELD | Admitting: Obstetrics

## 2021-07-27 VITALS — BP 125/84 | HR 120 | Ht 61.0 in | Wt 101.4 lb

## 2021-07-27 DIAGNOSIS — N94819 Vulvodynia, unspecified: Secondary | ICD-10-CM | POA: Diagnosis not present

## 2021-07-27 DIAGNOSIS — Z30430 Encounter for insertion of intrauterine contraceptive device: Secondary | ICD-10-CM

## 2021-07-27 LAB — POCT URINE PREGNANCY: Preg Test, Ur: NEGATIVE

## 2021-07-27 NOTE — Progress Notes (Signed)
SUBJECTIVE  Diana Leon is a 26 y.o. G0 who presents today for Pap smear and Paragard IUD placement. She would to avoid pregnancy due to her medical conditions. She has noted significant pain with vaginal exams in the past. She attempted to have a Pap in the past but was unable to complete the procedure due to pain. We previously discussed the procedure of IUD insertion and all potential alternative forms of contraception. Today, we reviewed the procedure. Risks were reviewed and consent obtained prior to beginning. I answered all of her questions.   OBJECTIVE  Vitals:   07/27/21 0958  BP: 125/84  Pulse: (!) 120  UPT negative  External genitalia appear normal. Unable to assess cervix with bimanual exam due to patient discomfort.  Gentle pressure with a Q-tip was painless on the labia and perineum; it provoked pain at the fourchette, introitus, and vaginal walls. Attempted speculum exam with small speculum; Cleopha was unable to tolerate it, and the procedure was stopped.  ASSESSMENT  1) Desires reliable contraception. Unable to tolerate IUD insertion. 2) Significant vaginal pain (vaginismus/vulvodynia) 3) Concern about pain when she becomes sexually active  PLAN  1) Reviewed other forms of contraception in detail. Discussed fertility awareness methods and barrier methods in the meantime. 2) Referral to pelvic PT 3) Discussed sexuality and female anatomy with patient and partner. Reviewed non-penetrative sexual activity, use of lubrication, and proceeding slowly at Kiyona's comfort level.  Return PRN to discuss further contraceptive needs or if she would like lab work for irregular periods.  Lloyd Huger, CNM

## 2021-08-03 ENCOUNTER — Telehealth: Payer: Self-pay | Admitting: Obstetrics

## 2021-08-03 NOTE — Telephone Encounter (Signed)
Pt called and stated that she could not tolerate the IUD insertion and was told to look into other alternatives. Pt stated she was interested in getting the nexplanon. Patient also stated that she was wants to have lab work done to see why she is having irregular menstrual bleeding. Please advise.

## 2021-08-06 ENCOUNTER — Other Ambulatory Visit: Payer: BLUE CROSS/BLUE SHIELD

## 2021-08-08 ENCOUNTER — Other Ambulatory Visit: Payer: Self-pay | Admitting: Obstetrics

## 2021-08-08 DIAGNOSIS — N926 Irregular menstruation, unspecified: Secondary | ICD-10-CM

## 2021-08-14 ENCOUNTER — Ambulatory Visit: Payer: BLUE CROSS/BLUE SHIELD | Admitting: Physical Therapy

## 2021-08-14 ENCOUNTER — Other Ambulatory Visit: Payer: Self-pay

## 2021-08-14 ENCOUNTER — Other Ambulatory Visit: Payer: BLUE CROSS/BLUE SHIELD

## 2021-08-14 DIAGNOSIS — N926 Irregular menstruation, unspecified: Secondary | ICD-10-CM

## 2021-08-15 ENCOUNTER — Other Ambulatory Visit: Payer: Self-pay | Admitting: Obstetrics

## 2021-08-15 DIAGNOSIS — N926 Irregular menstruation, unspecified: Secondary | ICD-10-CM

## 2021-11-07 ENCOUNTER — Telehealth: Payer: Self-pay | Admitting: Obstetrics

## 2021-11-07 NOTE — Telephone Encounter (Signed)
Patient called and stated that back in February she had an appt with [facility] to have a IUD placed. Stated that she called and spoke with staff and staff told her that she would not be charged for the appointment. However she has received a bill in the mail for $464.00 over the course of 3 appointments. First being 06/27/2021 and the other two being 07/27/2021. Patient is asking to receive a call from practice admin regarding this matter. Please advise.

## 2021-11-22 NOTE — Telephone Encounter (Signed)
Called and spoke with patient. Relayed information given from practice admin patient verbalized understanding.

## 2022-05-31 ENCOUNTER — Telehealth: Payer: Self-pay | Admitting: Obstetrics

## 2022-05-31 NOTE — Telephone Encounter (Signed)
Reached out to pt to reschedule 06/28/22 appointment with Missy bc of a schedule change.  Left message for pt to call back.

## 2022-06-12 NOTE — Telephone Encounter (Signed)
Reached out to pt (2x) to reschedule 06/28/22 appointment with Miss bc of a schedule change.  Left message for pt to call back.  Will send a MyChart message.

## 2022-06-12 NOTE — Telephone Encounter (Signed)
Pt wanted to cancel the appt, message sent through MyChart.

## 2022-06-28 ENCOUNTER — Encounter: Payer: Self-pay | Admitting: Obstetrics

## 2022-11-07 ENCOUNTER — Telehealth: Payer: Self-pay

## 2022-11-07 NOTE — Transitions of Care (Post Inpatient/ED Visit) (Signed)
Unable to reach pt by phone and left v/m for pt to call [phone]. Pt last saw Dr Lorin Picket 01/06/2019.     11/07/2022  Name: Diana Leon MRN: [id number] DOB: 1996-03-14  Today's TOC FU Call Status: Today's TOC FU Call Status:: Unsuccessul Call (1st Attempt) Unsuccessful Call (1st Attempt) Date: 11/07/22  Attempted to reach the patient regarding the most recent Inpatient/ED visit.  Follow Up Plan: Additional outreach attempts will be made to reach the patient to complete the Transitions of Care (Post Inpatient/ED visit) call.   Signature Lewanda Rife, LPN

## 2022-11-08 NOTE — Transitions of Care (Post Inpatient/ED Visit) (Signed)
Unable to reach pt by phone and left v/m requesting pt to cb [phone].      11/08/2022  Name: Diana Leon MRN: [id number] DOB: 12-18-1995  Today's TOC FU Call Status: Today's TOC FU Call Status:: Unsuccessful Call (2nd Attempt) Unsuccessful Call (1st Attempt) Date: 11/07/22 Unsuccessful Call (2nd Attempt) Date: 11/08/22  Attempted to reach the patient regarding the most recent Inpatient/ED visit.  Follow Up Plan: Additional outreach attempts will be made to reach the patient to complete the Transitions of Care (Post Inpatient/ED visit) call.   Signature Lewanda Rife, LPN

## 2022-11-12 NOTE — Telephone Encounter (Signed)
If she is being followed by another MD/PCP at [facility] - ok to remove me as PCP

## 2022-11-12 NOTE — Transitions of Care (Post Inpatient/ED Visit) (Signed)
Unable to reach pt by phone on the third attempt to reach pt; last time pt was seen by Dr Lorin Picket was 01/06/19. Appears per chart review that pt is being followed by [facility] Do you want to be removed as PCP. Sending note to Dr Dale Orviston.        11/12/2022  Name: Diana Leon MRN: [id number] DOB: 1995-07-26  Today's TOC FU Call Status: Today's TOC FU Call Status:: Unsuccessful Call (3rd Attempt) Unsuccessful Call (1st Attempt) Date: 11/07/22 Unsuccessful Call (2nd Attempt) Date: 11/08/22 Unsuccessful Call (3rd Attempt) Date: 11/12/22  Attempted to reach the patient regarding the most recent Inpatient/ED visit.  Follow Up Plan: No further outreach attempts will be made at this time. We have been unable to contact the patient.  Signature Lewanda Rife, LPN

## 2024-05-04 NOTE — Telephone Encounter (Signed)
 open in error
